# Patient Record
Sex: Female | Born: 1980 | Race: White | Hispanic: Yes | Smoking: Never smoker
Health system: Southern US, Community
[De-identification: ages and names within clinical notes are randomized; demographics above are authoritative.]

## PROBLEM LIST (undated history)

## (undated) ENCOUNTER — Inpatient Hospital Stay (HOSPITAL_COMMUNITY): Payer: Self-pay

## (undated) DIAGNOSIS — Z789 Other specified health status: Secondary | ICD-10-CM

## (undated) HISTORY — PX: GALLBLADDER SURGERY: SHX652

---

## 2001-09-14 ENCOUNTER — Encounter (INDEPENDENT_AMBULATORY_CARE_PROVIDER_SITE_OTHER): Payer: Self-pay

## 2001-09-14 ENCOUNTER — Inpatient Hospital Stay (HOSPITAL_COMMUNITY): Admission: AD | Admit: 2001-09-14 | Discharge: 2001-09-16 | Payer: Self-pay | Admitting: Obstetrics and Gynecology

## 2007-05-15 ENCOUNTER — Inpatient Hospital Stay (HOSPITAL_COMMUNITY): Admission: AD | Admit: 2007-05-15 | Discharge: 2007-05-15 | Payer: Self-pay | Admitting: Family Medicine

## 2007-05-15 ENCOUNTER — Ambulatory Visit: Payer: Self-pay | Admitting: Obstetrics and Gynecology

## 2007-08-21 ENCOUNTER — Ambulatory Visit: Payer: Self-pay | Admitting: Obstetrics and Gynecology

## 2007-08-21 ENCOUNTER — Inpatient Hospital Stay (HOSPITAL_COMMUNITY): Admission: AD | Admit: 2007-08-21 | Discharge: 2007-08-21 | Payer: Self-pay | Admitting: Family Medicine

## 2007-08-30 ENCOUNTER — Inpatient Hospital Stay (HOSPITAL_COMMUNITY): Admission: AD | Admit: 2007-08-30 | Discharge: 2007-09-01 | Payer: Self-pay | Admitting: Obstetrics and Gynecology

## 2007-08-30 ENCOUNTER — Ambulatory Visit: Payer: Self-pay | Admitting: *Deleted

## 2007-09-02 ENCOUNTER — Encounter: Admission: RE | Admit: 2007-09-02 | Discharge: 2007-09-02 | Payer: Self-pay | Admitting: Obstetrics & Gynecology

## 2008-01-01 ENCOUNTER — Emergency Department (HOSPITAL_COMMUNITY): Admission: EM | Admit: 2008-01-01 | Discharge: 2008-01-02 | Payer: Self-pay | Admitting: Emergency Medicine

## 2009-02-25 ENCOUNTER — Emergency Department (HOSPITAL_COMMUNITY): Admission: EM | Admit: 2009-02-25 | Discharge: 2009-02-26 | Payer: Self-pay | Admitting: Emergency Medicine

## 2009-03-29 ENCOUNTER — Emergency Department (HOSPITAL_COMMUNITY): Admission: EM | Admit: 2009-03-29 | Discharge: 2009-03-29 | Payer: Self-pay | Admitting: Emergency Medicine

## 2009-06-22 ENCOUNTER — Ambulatory Visit (HOSPITAL_COMMUNITY): Admission: RE | Admit: 2009-06-22 | Discharge: 2009-06-22 | Payer: Self-pay | Admitting: Obstetrics & Gynecology

## 2009-06-22 IMAGING — US US ABDOMEN COMPLETE
1 series · 14 of 25 positions shown · non-contrast
Comparison: None

CLINICAL DATA: Abdominal pain

ABDOMEN ULTRASOUND
TECHNIQUE: Complete abdominal ultrasound examination was performed
including evaluation of the liver, gallbladder, bile ducts,
pancreas, kidneys, spleen, IVC, and abdominal aorta.

[Series 1: unknown · 0.34mm/px · 14 of 55 slices shown]
[im 1/55]
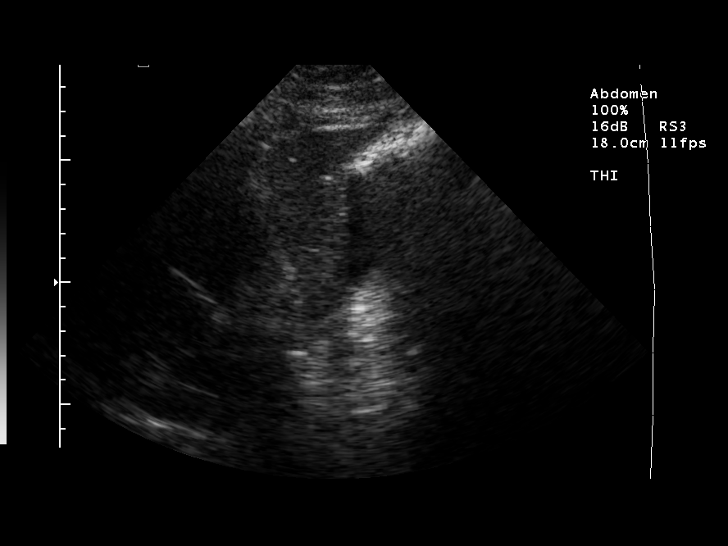
[im 5/55]
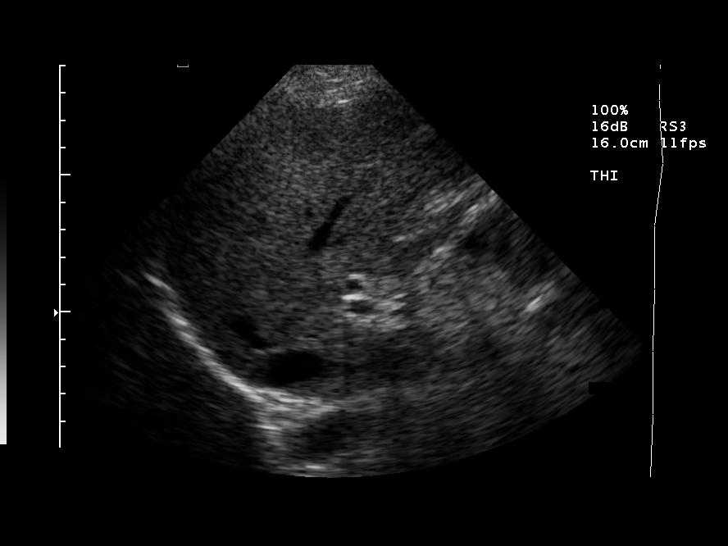
[im 10/55]
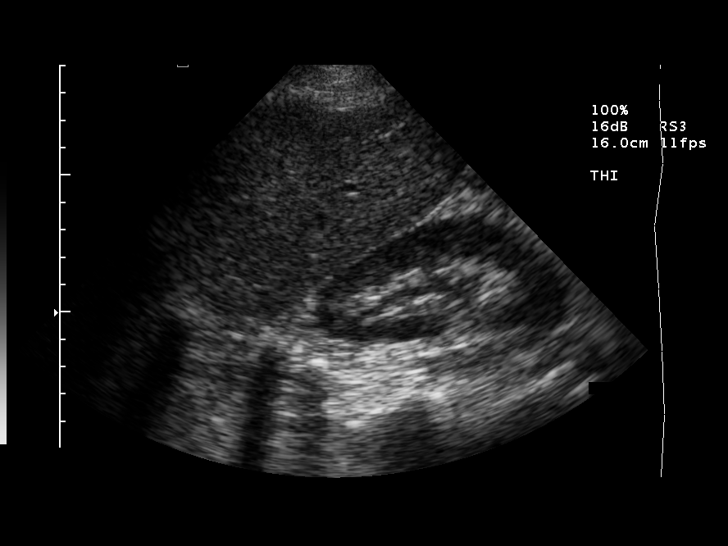
[im 14/55]
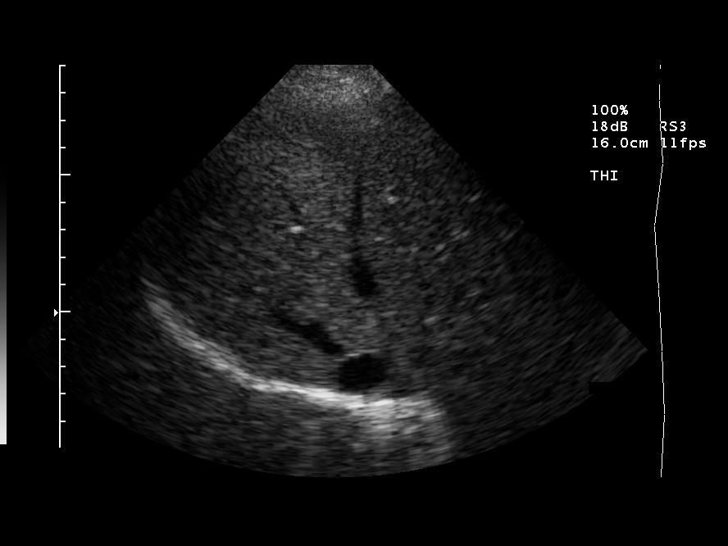
[im 19/55]
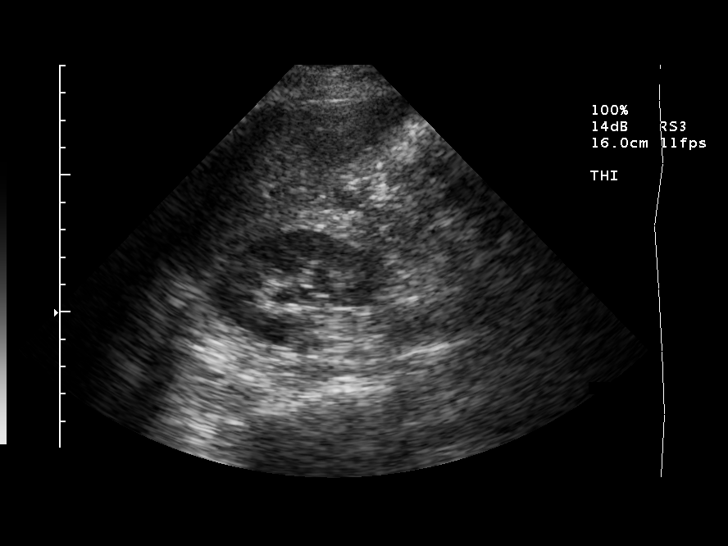
[im 21/55]
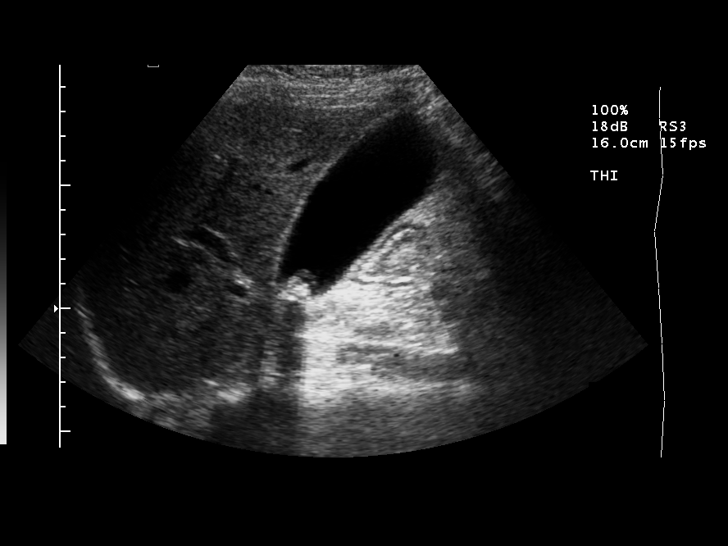
[im 25/55]
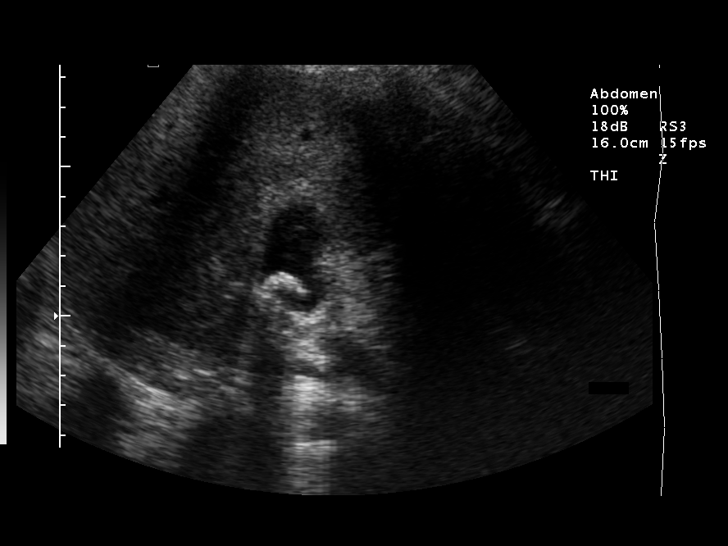
[im 30/55]
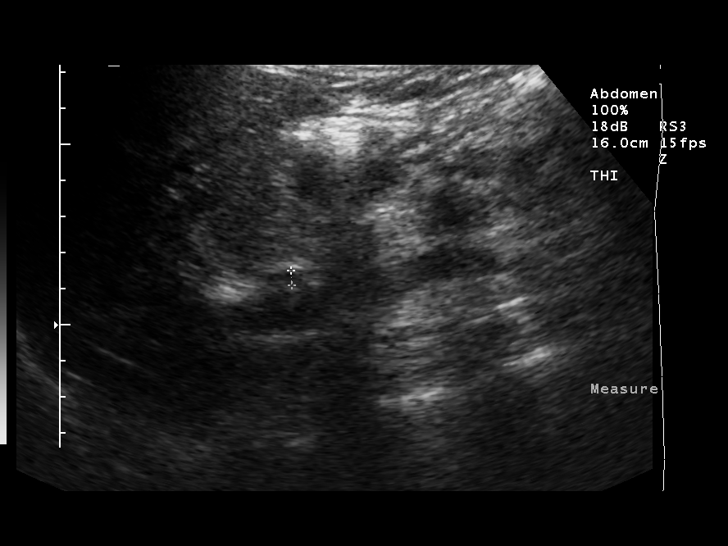
[im 34/55]
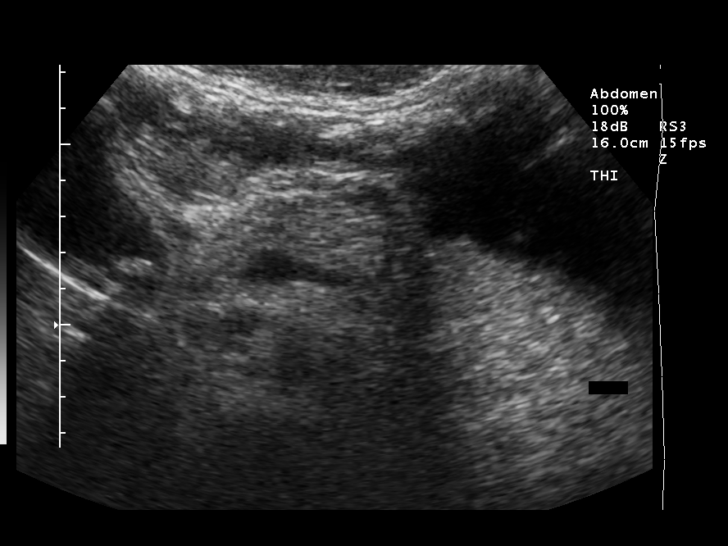
[im 37/55]
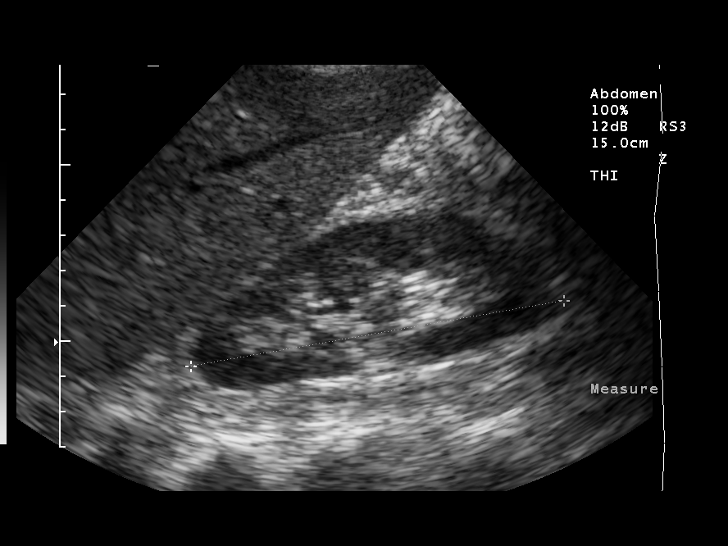
[im 41/55]
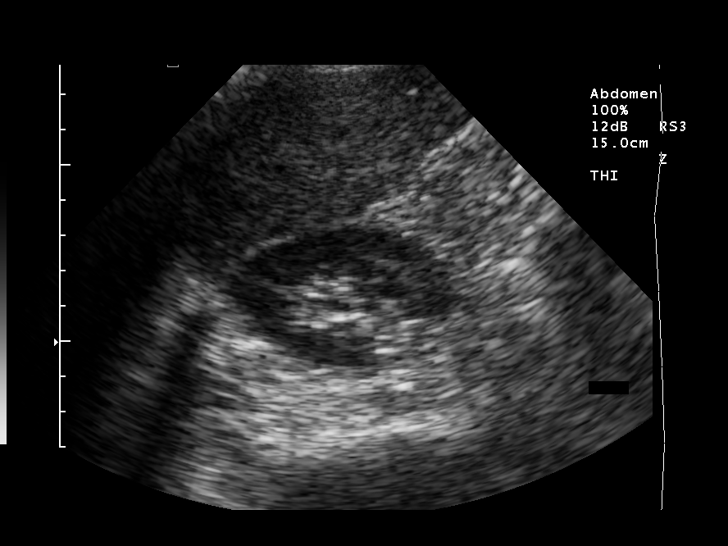
[im 46/55]
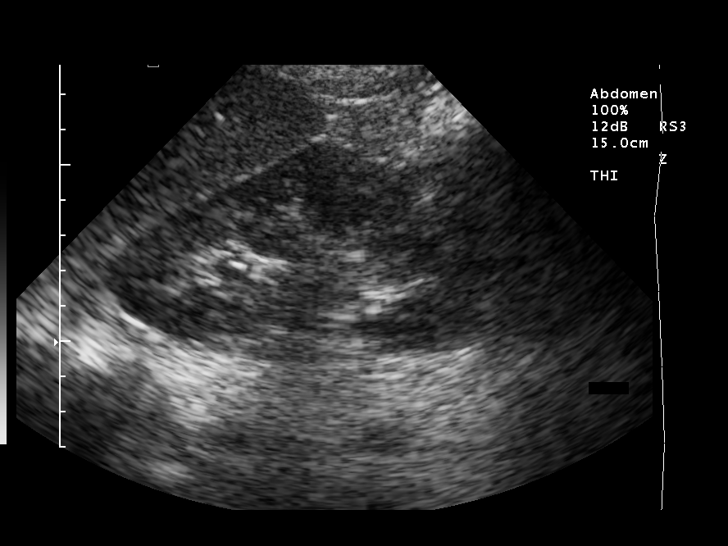
[im 50/55]
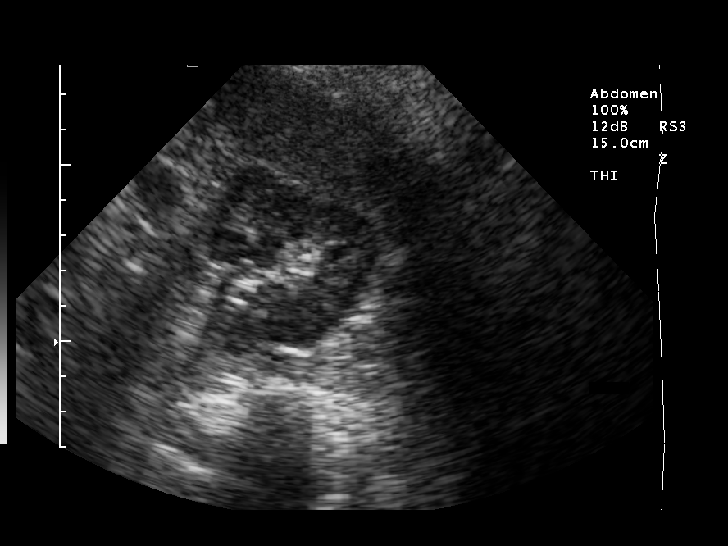
[im 55/55]
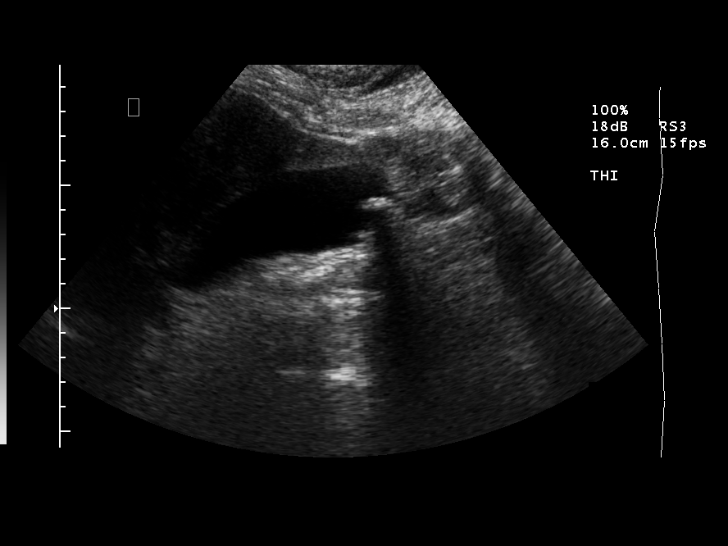

[14 of 25 positions shown; findings below may reference images not displayed]

FINDINGS: Normal appearance of the liver without intrahepatic
biliary dilatation.  There are echogenic structures within the
gallbladder with posterior acoustic shadowing.  Gallbladder wall
measures close to 3 mm and the gallbladder is moderately distended.
By report, the patient is tender to direct palpation of the
gallbladder.  Common bile duct measures 5 mm.  Pancreatic head and
body region are normal but the tail are obscured by bowel gas.  The
right kidney measures 10.5 cm in length without hydronephrosis..
The spleen measures up to 11.6 cm.  The left kidney has a normal
appearance, measuring 11 cm without hydronephrosis.  There are
multiple gallstones present.  No evidence for ascites.
IMPRESSION: Cholelithiasis with a sonographic Murphy's sign.  Findings can be
associated with acute cholecystitis.  No biliary dilatation.

## 2009-10-04 ENCOUNTER — Inpatient Hospital Stay (HOSPITAL_COMMUNITY): Admission: AD | Admit: 2009-10-04 | Discharge: 2009-10-06 | Payer: Self-pay | Admitting: Family Medicine

## 2009-10-04 ENCOUNTER — Ambulatory Visit: Payer: Self-pay | Admitting: Obstetrics and Gynecology

## 2010-08-16 IMAGING — US US OB TRANSVAGINAL MODIFY
1 series · 14 of 28 positions shown · non-contrast
Comparison: none

CLINICAL DATA: Abdominal pain.  First trimester pregnancy.

OBSTETRIC <14 WK US AND TRANSVAGINAL OB US
TECHNIQUE: Both transabdominal and transvaginal ultrasound
examinations were performed for complete evaluation of the
gestation as well as the maternal uterus, adnexal regions, and
pelvic cul-de-sac.

[Series 1: us ob transvaginal modify · 0.28mm/px · 14 of 48 slices shown]
[im 2/48]
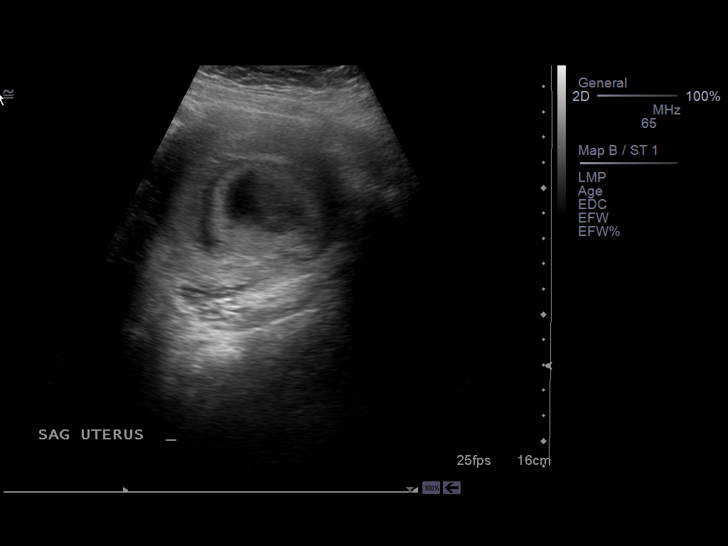
[im 6/48]
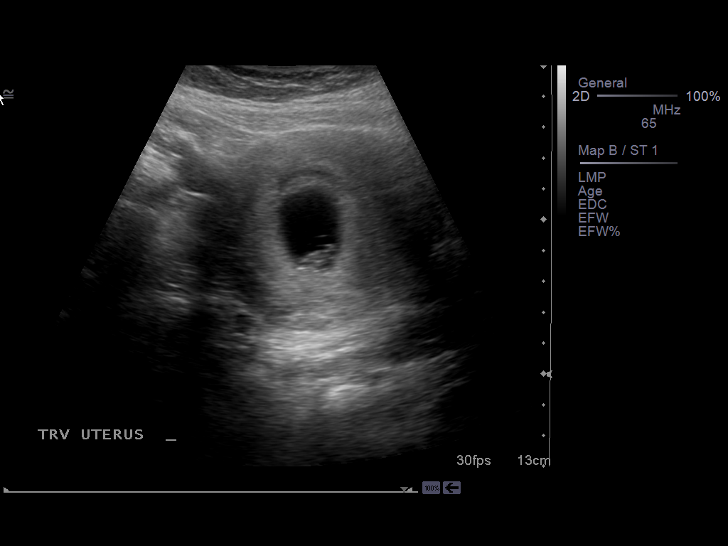
[im 9/48]
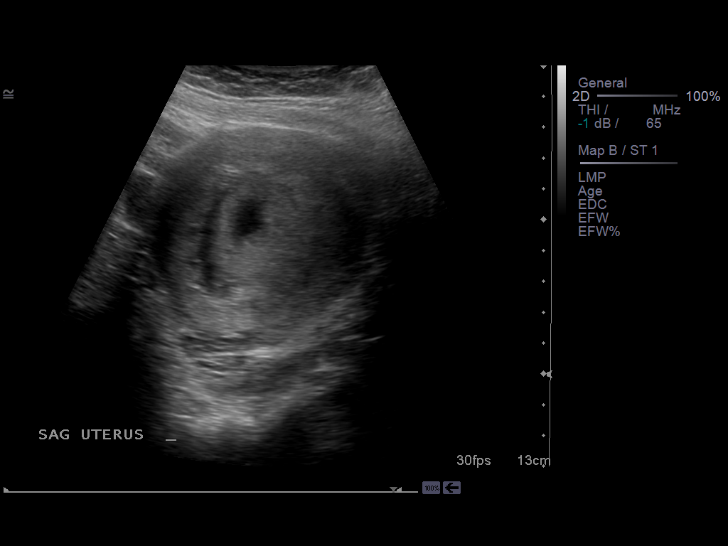
[im 13/48]
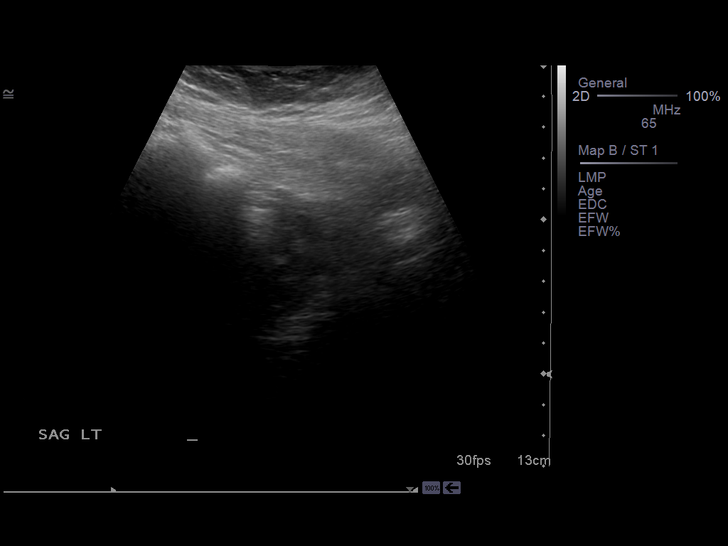
[im 16/48]
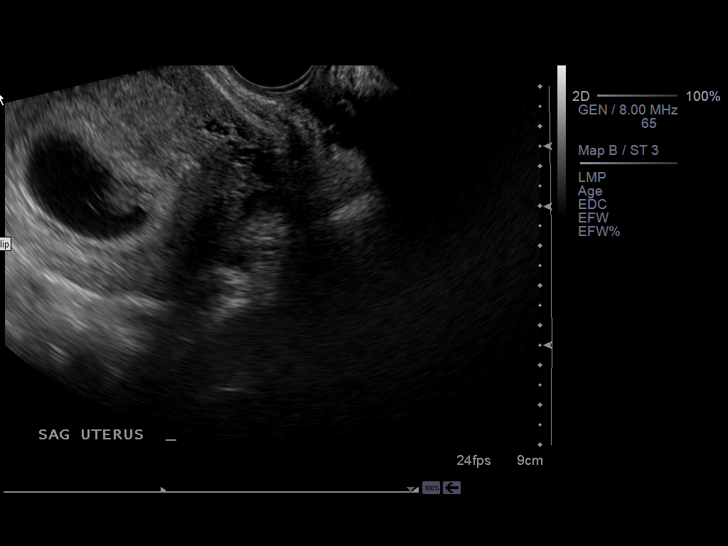
[im 20/48]
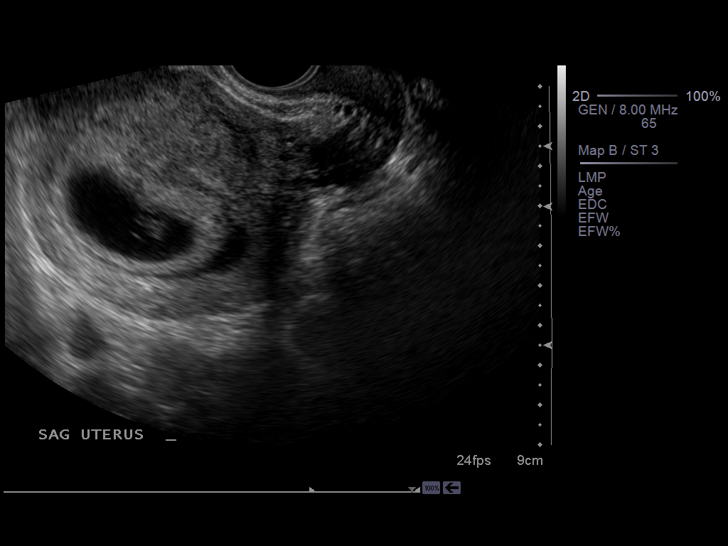
[im 23/48]
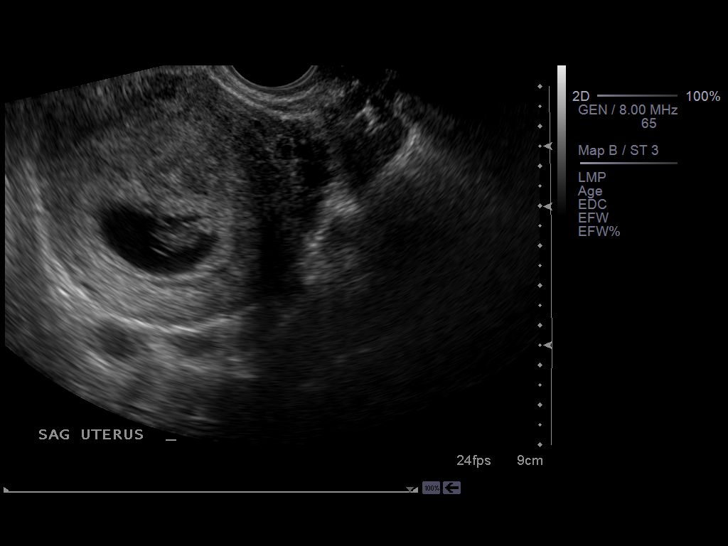
[im 27/48]
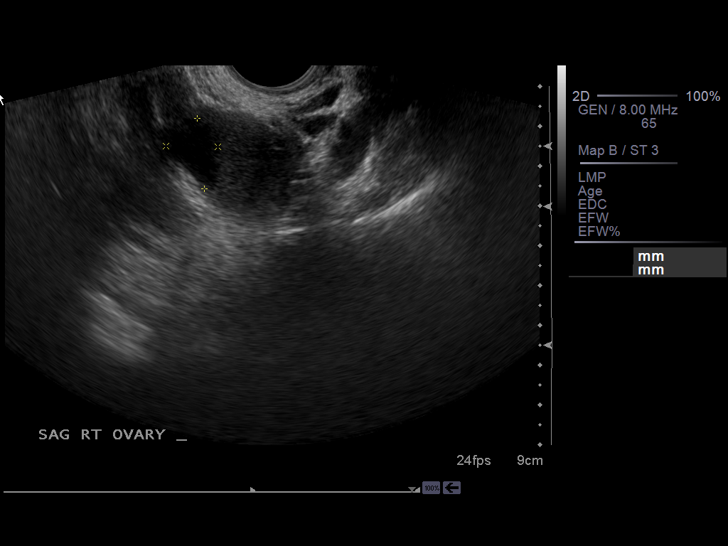
[im 30/48]
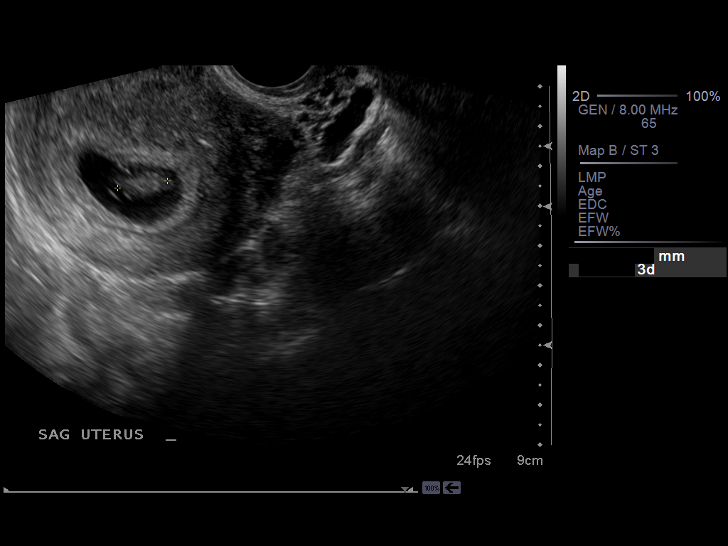
[im 34/48]
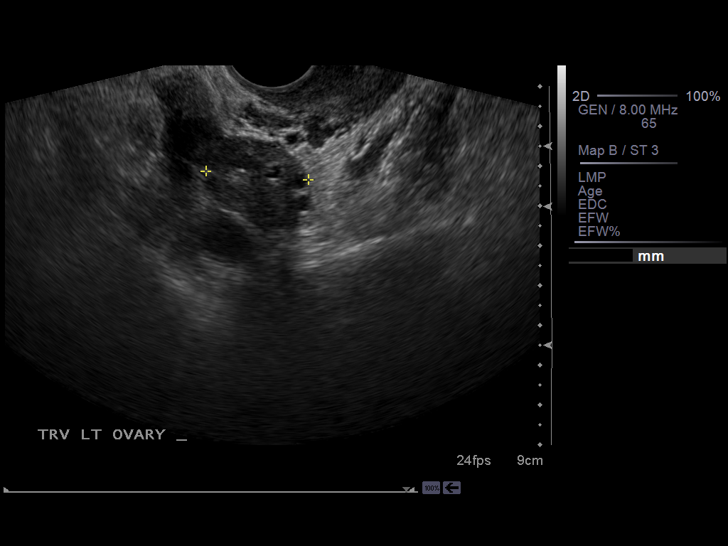
[im 37/48]
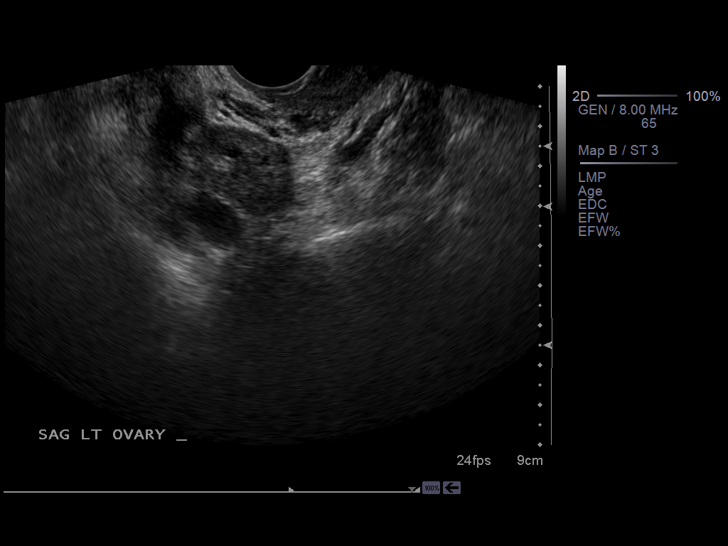
[im 41/48]
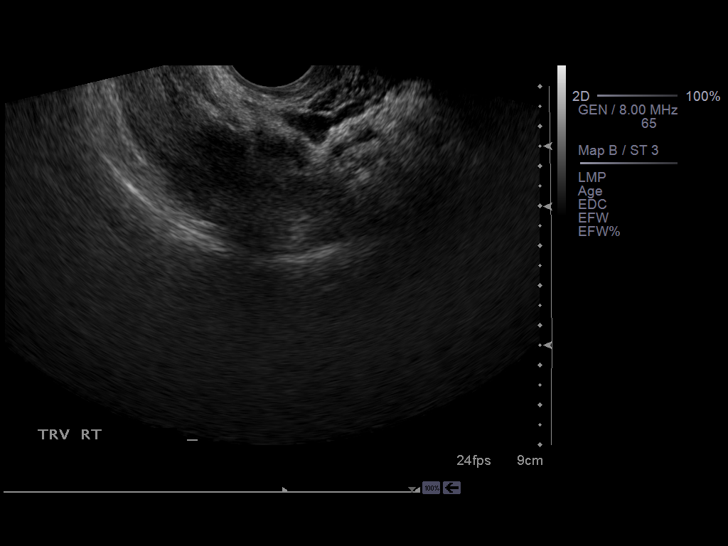
[im 44/48]
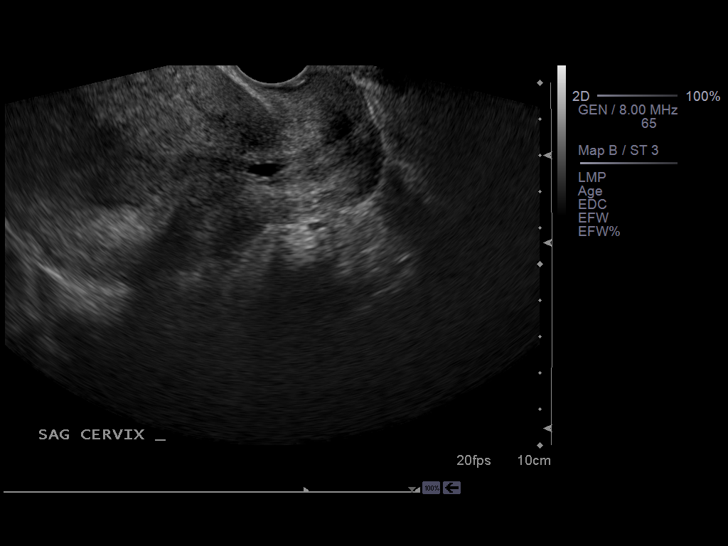
[im 48/48]
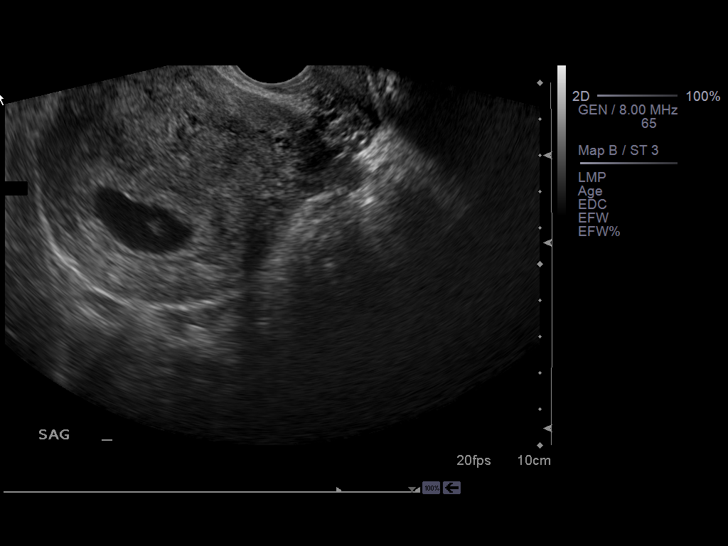

[14 of 28 positions shown; findings below may reference images not displayed]

FINDINGS: Single living intrauterine pregnancy noted with crown-
rump length of 12.7 cm compatible with 7 weeks 3 days gestation,
and with cardiac activity at 140 beats per minute.  Yolk sac noted
and the gestational sac appears properly sized subjectively.

There is a very small crescentic low echogenicity subchorionic
collection suggesting a small subchorionic hemorrhage.  There is a
small amount of fluid in the cervical canal.

Right ovary measures 3.8 x 2.6 x 2.9 cm contains a 1.8 x 1.3 x
cm cyst.

The left ovary measures 2.7 x 2.3 x 2.6 cm and appears
unremarkable.

No free pelvic fluid noted.
IMPRESSION: 1.  Single living intrauterine pregnancy measuring at 7 weeks 3
days gestation.  There is a very small subchorionic hemorrhage.
2.  Cyst in the right ovary.

## 2010-11-03 LAB — CBC
HCT: 20.7 % — ABNORMAL LOW (ref 36.0–46.0)
HCT: 25.2 % — ABNORMAL LOW (ref 36.0–46.0)
Hemoglobin: 6.2 g/dL — CL (ref 12.0–15.0)
Hemoglobin: 7.5 g/dL — ABNORMAL LOW (ref 12.0–15.0)
MCHC: 29.7 g/dL — ABNORMAL LOW (ref 30.0–36.0)
MCHC: 30.1 g/dL (ref 30.0–36.0)
MCV: 60 fL — ABNORMAL LOW (ref 78.0–100.0)
Platelets: 191 10*3/uL (ref 150–400)
RBC: 3.32 MIL/uL — ABNORMAL LOW (ref 3.87–5.11)
RBC: 4.19 MIL/uL (ref 3.87–5.11)
RDW: 19.2 % — ABNORMAL HIGH (ref 11.5–15.5)
RDW: 19.3 % — ABNORMAL HIGH (ref 11.5–15.5)
WBC: 10 10*3/uL (ref 4.0–10.5)

## 2010-11-19 LAB — URINALYSIS, ROUTINE W REFLEX MICROSCOPIC
Nitrite: NEGATIVE
Protein, ur: 30 mg/dL — AB
Specific Gravity, Urine: 1.03 (ref 1.005–1.030)
Urobilinogen, UA: 1 mg/dL (ref 0.0–1.0)
pH: 5.5 (ref 5.0–8.0)

## 2010-11-19 LAB — URINE MICROSCOPIC-ADD ON

## 2010-11-19 LAB — POCT PREGNANCY, URINE: Preg Test, Ur: POSITIVE

## 2010-11-21 LAB — POCT I-STAT, CHEM 8
Calcium, Ion: 1.2 mmol/L (ref 1.12–1.32)
Chloride: 105 mEq/L (ref 96–112)
Creatinine, Ser: 0.6 mg/dL (ref 0.4–1.2)
Glucose, Bld: 90 mg/dL (ref 70–99)
HCT: 36 % (ref 36.0–46.0)
Sodium: 138 mEq/L (ref 135–145)
TCO2: 21 mmol/L (ref 0–100)

## 2010-11-21 LAB — HCG, QUANTITATIVE, PREGNANCY: hCG, Beta Chain, Quant, S: 107907 m[IU]/mL — ABNORMAL HIGH (ref <5)

## 2010-11-21 LAB — GC/CHLAMYDIA PROBE AMP, GENITAL
Chlamydia, DNA Probe: POSITIVE — AB
GC Probe Amp, Genital: NEGATIVE

## 2010-11-21 LAB — URINALYSIS, ROUTINE W REFLEX MICROSCOPIC
Glucose, UA: NEGATIVE mg/dL
Ketones, ur: >80 mg/dL — AB
Specific Gravity, Urine: 1.043 — ABNORMAL HIGH (ref 1.005–1.030)
Urobilinogen, UA: 1 mg/dL (ref 0.0–1.0)

## 2010-11-21 LAB — WET PREP, GENITAL: Trich, Wet Prep: NONE SEEN

## 2010-11-21 LAB — URINE MICROSCOPIC-ADD ON

## 2010-11-21 LAB — URINE CULTURE: Culture: NO GROWTH

## 2011-05-03 LAB — CBC
HCT: 32 — ABNORMAL LOW
Hemoglobin: 10.4 — ABNORMAL LOW
MCHC: 32.4
Platelets: 257

## 2011-05-03 LAB — RUBELLA SCREEN: Rubella: >500 — ABNORMAL HIGH

## 2011-05-03 LAB — HERPES SIMPLEX VIRUS CULTURE: Culture: NOT DETECTED

## 2011-05-03 LAB — TYPE AND SCREEN
ABO/RH(D): O POS
Antibody Screen: NEGATIVE

## 2011-05-03 LAB — URINALYSIS, ROUTINE W REFLEX MICROSCOPIC
Ketones, ur: 15 — AB
Nitrite: NEGATIVE
Protein, ur: NEGATIVE
pH: 5.5

## 2011-05-03 LAB — DIFFERENTIAL
Basophils Relative: 0
Lymphs Abs: 2.3
Monocytes Absolute: 0.7
Monocytes Relative: 6
Neutro Abs: 8.7 — ABNORMAL HIGH

## 2011-05-03 LAB — GC/CHLAMYDIA PROBE AMP, GENITAL: GC Probe Amp, Genital: NEGATIVE

## 2011-05-03 LAB — RAPID URINE DRUG SCREEN, HOSP PERFORMED
Amphetamines: NOT DETECTED
Tetrahydrocannabinol: NOT DETECTED

## 2011-05-03 LAB — HIV ANTIBODY (ROUTINE TESTING W REFLEX): HIV: NONREACTIVE

## 2011-05-03 LAB — STREP B DNA PROBE

## 2011-05-03 LAB — RPR: RPR Ser Ql: NONREACTIVE

## 2011-05-03 LAB — HEPATITIS B SURFACE ANTIGEN: Hepatitis B Surface Ag: NEGATIVE

## 2011-05-04 LAB — CBC
HCT: 31.6 — ABNORMAL LOW
Hemoglobin: 10 — ABNORMAL LOW
RDW: 16.3 — ABNORMAL HIGH
WBC: 11.8 — ABNORMAL HIGH

## 2011-05-10 LAB — URINALYSIS, ROUTINE W REFLEX MICROSCOPIC
Glucose, UA: NEGATIVE
Leukocytes, UA: NEGATIVE
Protein, ur: NEGATIVE
Specific Gravity, Urine: 1.023
Urobilinogen, UA: 0.2

## 2011-05-10 LAB — COMPREHENSIVE METABOLIC PANEL
CO2: 24
Calcium: 9.1
Creatinine, Ser: 0.56
GFR calc non Af Amer: >60
Glucose, Bld: 130 — ABNORMAL HIGH
Total Protein: 7.1

## 2011-05-10 LAB — DIFFERENTIAL
Basophils Relative: 0
Eosinophils Relative: 0
Lymphocytes Relative: 9 — ABNORMAL LOW
Monocytes Relative: 2 — ABNORMAL LOW
Neutrophils Relative %: 89 — ABNORMAL HIGH

## 2011-05-10 LAB — URINE MICROSCOPIC-ADD ON

## 2011-05-10 LAB — POCT PREGNANCY, URINE: Preg Test, Ur: NEGATIVE

## 2011-05-10 LAB — CBC
Hemoglobin: 10.9 — ABNORMAL LOW
MCHC: 32.4
MCV: 68.2 — ABNORMAL LOW
RBC: 4.94
RDW: 15.4

## 2011-05-10 LAB — LIPASE, BLOOD: Lipase: 23

## 2011-05-25 LAB — GC/CHLAMYDIA PROBE AMP, GENITAL
Chlamydia, DNA Probe: POSITIVE — AB
GC Probe Amp, Genital: NEGATIVE

## 2014-08-14 NOTE — L&D Delivery Note (Signed)
**Note Kelly-Identified via Obfuscation**     Delivery Note AROM at 12:45 AM with clear fluid. After ROM, patient dilated to complete quickly and began to push. At 12:58 AM a viable and healthy female was precipitously delivered via Vaginal, Spontaneous Delivery (Presentation: Right Occiput Anterior).  APGAR: 9, 9; weight 8 lb 3.9 oz (3740 g).   Placenta status: Intact, Spontaneous.  Cord: 3 vessels with the following complications: None.  Cord pH: N/A  Anesthesia: None  Episiotomy: None Lacerations: None Suture Repair: none Est. Blood Loss (mL): 50  Mom to postpartum.  Baby to Couplet care / Skin to Skin.  Kelly Maldonado 03/11/2015, 1:54 AM

## 2015-01-03 ENCOUNTER — Inpatient Hospital Stay (HOSPITAL_COMMUNITY)
Admission: AD | Admit: 2015-01-03 | Discharge: 2015-01-04 | Disposition: A | Discharge status: Home / Self Care | Payer: Medicaid Other | Source: Ambulatory Visit | Attending: Obstetrics & Gynecology | Admitting: Obstetrics & Gynecology

## 2015-01-03 ENCOUNTER — Encounter (HOSPITAL_COMMUNITY): Payer: Self-pay | Admitting: *Deleted

## 2015-01-03 ENCOUNTER — Inpatient Hospital Stay (HOSPITAL_COMMUNITY): Payer: Medicaid Other

## 2015-01-03 DIAGNOSIS — D649 Anemia, unspecified: Secondary | ICD-10-CM

## 2015-01-03 DIAGNOSIS — Z3689 Encounter for other specified antenatal screening: Secondary | ICD-10-CM

## 2015-01-03 DIAGNOSIS — O99013 Anemia complicating pregnancy, third trimester: Secondary | ICD-10-CM | POA: Diagnosis not present

## 2015-01-03 DIAGNOSIS — O2343 Unspecified infection of urinary tract in pregnancy, third trimester: Secondary | ICD-10-CM | POA: Diagnosis not present

## 2015-01-03 DIAGNOSIS — Z3A31 31 weeks gestation of pregnancy: Secondary | ICD-10-CM | POA: Diagnosis not present

## 2015-01-03 DIAGNOSIS — R42 Dizziness and giddiness: Secondary | ICD-10-CM | POA: Diagnosis present

## 2015-01-03 DIAGNOSIS — O0930 Supervision of pregnancy with insufficient antenatal care, unspecified trimester: Secondary | ICD-10-CM | POA: Diagnosis present

## 2015-01-03 DIAGNOSIS — O0933 Supervision of pregnancy with insufficient antenatal care, third trimester: Secondary | ICD-10-CM | POA: Diagnosis not present

## 2015-01-03 HISTORY — DX: Other specified health status: Z78.9

## 2015-01-03 LAB — URINE MICROSCOPIC-ADD ON

## 2015-01-03 LAB — URINALYSIS, ROUTINE W REFLEX MICROSCOPIC
GLUCOSE, UA: NEGATIVE mg/dL
HGB URINE DIPSTICK: NEGATIVE
KETONES UR: 40 mg/dL — AB
NITRITE: POSITIVE — AB
Protein, ur: 30 mg/dL — AB
Urobilinogen, UA: 1 mg/dL (ref 0.0–1.0)
pH: 5.5 (ref 5.0–8.0)

## 2015-01-03 LAB — CBC
HEMATOCRIT: 23.9 % — AB (ref 36.0–46.0)
Hemoglobin: 6.8 g/dL — CL (ref 12.0–15.0)
MCH: 16.4 pg — ABNORMAL LOW (ref 26.0–34.0)
MCHC: 28.5 g/dL — ABNORMAL LOW (ref 30.0–36.0)
MCV: 57.7 fL — ABNORMAL LOW (ref 78.0–100.0)
PLATELETS: 281 10*3/uL (ref 150–400)
RBC: 4.14 MIL/uL (ref 3.87–5.11)
RDW: 18.9 % — AB (ref 11.5–15.5)
WBC: 10.5 10*3/uL (ref 4.0–10.5)

## 2015-01-03 LAB — OB RESULTS CONSOLE HIV ANTIBODY (ROUTINE TESTING): HIV: NONREACTIVE

## 2015-01-03 LAB — RAPID HIV SCREEN (HIV 1/2 AB+AG)
HIV 1/2 Antibodies: NONREACTIVE
HIV-1 P24 ANTIGEN - HIV24: NONREACTIVE

## 2015-01-03 LAB — HEPATITIS B SURFACE ANTIGEN: Hepatitis B Surface Ag: NEGATIVE

## 2015-01-03 LAB — DIFFERENTIAL
Basophils Absolute: 0 10*3/uL (ref 0.0–0.1)
Basophils Relative: 0 % (ref 0–1)
Eosinophils Absolute: 0.4 10*3/uL (ref 0.0–0.7)
Eosinophils Relative: 4 % (ref 0–5)
Lymphocytes Relative: 22 % (ref 12–46)
Lymphs Abs: 2.3 10*3/uL (ref 0.7–4.0)
MONO ABS: 0.6 10*3/uL (ref 0.1–1.0)
Monocytes Relative: 5 % (ref 3–12)
NEUTROS ABS: 7.2 10*3/uL (ref 1.7–7.7)
Neutrophils Relative %: 69 % (ref 43–77)

## 2015-01-03 LAB — PREPARE RBC (CROSSMATCH)

## 2015-01-03 LAB — ABO/RH: ABO/RH(D): O POS

## 2015-01-03 MED ORDER — SODIUM CHLORIDE 0.9 % IV SOLN: Freq: Once | INTRAVENOUS | Status: DC

## 2015-01-03 MED ORDER — FERROUS SULFATE 325 (65 FE) MG PO TABS
325.0000 mg | ORAL_TABLET | Freq: Two times a day (BID) | ORAL | Status: DC
Start: 2015-01-03 — End: 2022-05-27

## 2015-01-03 MED ORDER — ACETAMINOPHEN 500 MG PO TABS
1000.0000 mg | ORAL_TABLET | Freq: Once | ORAL | Status: AC
  Administered 2015-01-03: 1000 mg via ORAL
  Filled 2015-01-03: qty 2

## 2015-01-03 MED ORDER — SODIUM CHLORIDE 0.9 % IV SOLN
Freq: Once | INTRAVENOUS | Status: AC
  Administered 2015-01-03: 13:00:00 via INTRAVENOUS

## 2015-01-03 MED ORDER — PRENATAL COMPLETE 14-0.4 MG PO TABS: 1.0000 | ORAL_TABLET | Freq: Every morning | ORAL | Status: DC

## 2015-01-03 MED ORDER — CEPHALEXIN 500 MG PO CAPS: 500.0000 mg | ORAL_CAPSULE | Freq: Four times a day (QID) | ORAL | Status: DC

## 2015-01-03 MED ORDER — DIPHENHYDRAMINE HCL 25 MG PO CAPS
50.0000 mg | ORAL_CAPSULE | Freq: Once | ORAL | Status: AC
  Administered 2015-01-03: 50 mg via ORAL
  Filled 2015-01-03: qty 2

## 2015-01-03 NOTE — Progress Notes (Signed)
Checked on patients needs.  °Spanish Interpreter  °

## 2015-01-03 NOTE — Progress Notes (Signed)
Checked on patients needs. Also assisted RN with interpretation of medical questions.  Spanish Interpreter

## 2015-01-03 NOTE — Progress Notes (Signed)
Checked on patients needs. Also assisted RN with interpretation of medical procedure.  Also ordered patients breakfast and late snack.  Spanish Interpreter

## 2015-01-03 NOTE — MAU Note (Signed)
Pt. States she is here for prenatal care. LMP in October 2015. Denies bleeding or LOF. Pt. Feels baby move well. Pt. States she is feeling dizzy and this began yesterday. Pt. Got out of her car yesterday and felt short of breath at that time and states she had a headache at that time as well. Pt. States she is having nausea and vomiting throughout pregnancy as well. Pt. States she delivered close to her due date with all of her other pregnancies. Last pregnancy was 2 years ago. Pt. States she believes she needed a blood transfusion 2 pregnancies ago and [place]

## 2015-01-03 NOTE — MAU Note (Signed)
Marta spanish interpreter in room with provider. Pt. To be transferred to room 304. Food tray ordered at this time. PT. And SO aware and agreeable to plan of care.

## 2015-01-03 NOTE — Progress Notes (Signed)
Checked on patients needs. Also ordered patients dinner Spanish Interpreter

## 2015-01-03 NOTE — MAU Note (Signed)
Report given to Osvaldo HumanKim Coddle, RN ok for pt. Transport to room 304.

## 2015-01-03 NOTE — H&P (Signed)
Shyrl NumbersMinerva Navarrete-Cruz is a 56P5, 34 y.o. female presenting for dizziness, weakness and fatigue, no PNC Maternal Medical History:  Reason for admission: G6P5 at 31 wks no prenatal care in with c/o malaise and feelinf dizzy  Prenatal complications: No prenatal care and severe anemia    OB History    Gravida Para Term Preterm AB TAB SAB Ectopic Multiple Living   6 5 5  0 0 0 0 0 0 5      Obstetric Comments   Pt. States she delivered her first baby here and two other babies at this hospital. Denies prenatal care until now.      Past Medical History  Diagnosis Date  . Medical history non-contributory    Past Surgical History  Procedure Laterality Date  . Gallbladder surgery     Family History: family history is not on file. Social History:  reports that she has never smoked. She does not have any smokeless tobacco history on file. She reports that she does not drink alcohol or use illicit drugs.   Prenatal Transfer Tool  Maternal Diabetes: No Genetic Screening: Declined Maternal Ultrasounds/Referrals: Declined Fetal Ultrasounds or other Referrals:  None Maternal Substance Abuse:  No Significant Maternal Medications:  None Significant Maternal Lab Results:  None Other Comments:  no prenatal care  Review of Systems  Constitutional: Positive for malaise/fatigue.  HENT: Negative.   Eyes: Positive for blurred vision.  Respiratory: Negative.   Cardiovascular: Negative.   Gastrointestinal: Negative.   Genitourinary: Negative.   Musculoskeletal: Negative.   Neurological: Positive for dizziness and weakness.  Endo/Heme/Allergies: Negative.   Psychiatric/Behavioral: Negative.       Blood pressure 106/69, pulse 93, temperature 98.3 F (36.8 C), temperature source Oral, resp. rate 18, weight 171 lb (77.565 kg), SpO2 99 %. Maternal Exam:  Uterine Assessment: No contractions  Abdomen: Fetal presentation: breech  Introitus: Normal vulva. Normal vagina.  Ferning test: not done.   Nitrazine test: not done.  Pelvis: adequate for delivery.      Fetal Exam Fetal Monitor Review: Mode: ultrasound.   Variability: moderate (6-25 bpm).   Pattern: no accelerations.    Fetal State Assessment: Category I - tracings are normal.     Physical Exam  Constitutional: She is oriented to person, place, and time. She appears well-developed and well-nourished.  HENT:  Head: Normocephalic.  Eyes: Pupils are equal, round, and reactive to light.  Neck: Normal range of motion.  Cardiovascular: Normal rate, regular rhythm, normal heart sounds and intact distal pulses.   Respiratory: Effort normal and breath sounds normal.  GI: Soft. Bowel sounds are normal.  Musculoskeletal: Normal range of motion.  Neurological: She is alert and oriented to person, place, and time. She has normal reflexes.  Skin: Skin is warm and dry.  Psychiatric: She has a normal mood and affect. Her behavior is normal. Judgment and thought content normal.    Prenatal labs: ABO, Rh: --/--/O POS (05/22 [id number]) Antibody: NEG (05/22 0954) Rubella:   RPR:    HBsAg:    HIV:    GBS:     Assessment/Plan: Discussed POC with Dr. Macon LargeAnyanwu pt to received 3 uts of packed RBC's [facility]  LAWSON, MARIE DARLENE 01/03/2015, 11:40 AM

## 2015-01-04 DIAGNOSIS — O0930 Supervision of pregnancy with insufficient antenatal care, unspecified trimester: Secondary | ICD-10-CM

## 2015-01-04 DIAGNOSIS — Z3A31 31 weeks gestation of pregnancy: Secondary | ICD-10-CM

## 2015-01-04 DIAGNOSIS — O2343 Unspecified infection of urinary tract in pregnancy, third trimester: Secondary | ICD-10-CM

## 2015-01-04 LAB — RPR: RPR Ser Ql: NONREACTIVE

## 2015-01-04 LAB — TYPE AND SCREEN
ABO/RH(D): O POS
ANTIBODY SCREEN: NEGATIVE
UNIT DIVISION: 0
Unit division: 0
Unit division: 0

## 2015-01-04 LAB — CBC
HCT: 29.1 % — ABNORMAL LOW (ref 36.0–46.0)
Hemoglobin: 8.9 g/dL — ABNORMAL LOW (ref 12.0–15.0)
MCH: 19.1 pg — ABNORMAL LOW (ref 26.0–34.0)
MCHC: 30.6 g/dL (ref 30.0–36.0)
MCV: 62.3 fL — ABNORMAL LOW (ref 78.0–100.0)
Platelets: 255 10*3/uL (ref 150–400)
RBC: 4.67 MIL/uL (ref 3.87–5.11)
RDW: 22.5 % — ABNORMAL HIGH (ref 11.5–15.5)
WBC: 11.6 10*3/uL — ABNORMAL HIGH (ref 4.0–10.5)

## 2015-01-04 LAB — CULTURE, OB URINE

## 2015-01-04 LAB — RUBELLA SCREEN: Rubella: 17.2 index (ref >0.99)

## 2015-01-04 NOTE — Discharge Summary (Signed)
Antenatal Physician Discharge Summary  Patient ID: Kelly Maldonado MRN: [id number] DOB/AGE: 10/28/1980 34 y.o.  Admit date: 01/03/2015 Discharge date: 01/04/2015  Admission Diagnoses: Intrauterine pregnancy at 5582w4d, no prenatal care, urinary tract infection, symptomatic anemia  Discharge Diagnoses:  Intrauterine pregnancy at 4742w5d, no prenatal care, urinary tract infection being treated, symptomatic anemia s/p transfusion  Prenatal Procedures: NST, Ultrasound   Significant Diagnostic Studies:  Results for orders placed or performed during the hospital encounter of 01/03/15 (from the past 168 hour(s))  Urinalysis, Routine w reflex microscopic   Collection Time: 01/03/15  8:50 AM  Result Value Ref Range   Color, Urine RED (A) YELLOW   APPearance TURBID (A) CLEAR   Specific Gravity, Urine >1.030 (H) 1.005 - 1.030   pH 5.5 5.0 - 8.0   Glucose, UA NEGATIVE NEGATIVE mg/dL   Hgb urine dipstick NEGATIVE NEGATIVE   Bilirubin Urine SMALL (A) NEGATIVE   Ketones, ur 40 (A) NEGATIVE mg/dL   Protein, ur 30 (A) NEGATIVE mg/dL   Urobilinogen, UA 1.0 0.0 - 1.0 mg/dL   Nitrite POSITIVE (A) NEGATIVE   Leukocytes, UA SMALL (A) NEGATIVE  Urine microscopic-add on   Collection Time: 01/03/15  8:50 AM  Result Value Ref Range   Squamous Epithelial / LPF RARE RARE   WBC, UA 3-6 <3 WBC/hpf   Bacteria, UA RARE RARE   Urine-Other MUCOUS PRESENT   Hepatitis B surface antigen   Collection Time: 01/03/15  9:54 AM  Result Value Ref Range   Hepatitis B Surface Ag NEGATIVE NEGATIVE  CBC   Collection Time: 01/03/15  9:54 AM  Result Value Ref Range   WBC 10.5 4.0 - 10.5 K/uL   RBC 4.14 3.87 - 5.11 MIL/uL   Hemoglobin 6.8 (LL) 12.0 - 15.0 g/dL   HCT 64.423.9 (L) 03.436.0 - 74.246.0 %   MCV 57.7 (L) 78.0 - 100.0 fL   MCH 16.4 (L) 26.0 - 34.0 pg   MCHC 28.5 (L) 30.0 - 36.0 g/dL   RDW 59.518.9 (H) 63.811.5 - 75.615.5 %   Platelets 281 150 - 400 K/uL  Differential   Collection Time: 01/03/15  9:54 AM  Result Value Ref  Range   Neutrophils Relative % 69 43 - 77 %   Neutro Abs 7.2 1.7 - 7.7 K/uL   Lymphocytes Relative 22 12 - 46 %   Lymphs Abs 2.3 0.7 - 4.0 K/uL   Monocytes Relative 5 3 - 12 %   Monocytes Absolute 0.6 0.1 - 1.0 K/uL   Eosinophils Relative 4 0 - 5 %   Eosinophils Absolute 0.4 0.0 - 0.7 K/uL   Basophils Relative 0 0 - 1 %   Basophils Absolute 0.0 0.0 - 0.1 K/uL  Rapid HIV screen (HIV 1/2 Ab+Ag)   Collection Time: 01/03/15  9:54 AM  Result Value Ref Range   HIV-1 P24 Antigen - HIV24 NON REACTIVE NON REACTIVE   HIV 1/2 Antibodies NON REACTIVE NON REACTIVE   Interpretation (HIV Ag Ab)      A non reactive test result means that HIV 1 or HIV 2 antibodies and HIV 1 p24 antigen were not detected in the specimen.  Type and screen   Collection Time: 01/03/15  9:54 AM  Result Value Ref Range   ABO/RH(D) O POS    Antibody Screen NEG    Sample Expiration 01/06/2015    Unit Number [id number]    Blood Component Type RBC LR PHER2    Unit division 00    Status of Unit ISSUED  Transfusion Status OK TO TRANSFUSE    Crossmatch Result Compatible    Unit Number [id number]    Blood Component Type RBC LR PHER2    Unit division 00    Status of Unit ISSUED    Transfusion Status OK TO TRANSFUSE    Crossmatch Result Compatible    Unit Number [id number]    Blood Component Type RBC LR PHER2    Unit division 00    Status of Unit ISSUED    Transfusion Status OK TO TRANSFUSE    Crossmatch Result Compatible   ABO/Rh   Collection Time: 01/03/15  9:54 AM  Result Value Ref Range   ABO/RH(D) O POS   Prepare RBC   Collection Time: 01/03/15 12:23 PM  Result Value Ref Range   Order Confirmation ORDER PROCESSED BY BLOOD BANK   CBC   Collection Time: 01/04/15  5:20 AM  Result Value Ref Range   WBC 11.6 (H) 4.0 - 10.5 K/uL   RBC 4.67 3.87 - 5.11 MIL/uL   Hemoglobin 8.9 (L) 12.0 - 15.0 g/dL   HCT 59.5 (L) 63.8 - 75.6 %   MCV 62.3 (L) 78.0 - 100.0 fL   MCH 19.1 (L) 26.0 - 34.0 pg   MCHC 30.6  30.0 - 36.0 g/dL   RDW 43.3 (H) 29.5 - 18.8 %   Platelets 255 150 - 400 K/uL    Treatments: IV hydration, antibiotics: Keflex and tranfusion of 3 units of pRBcs  Hospital Course:  This is a 34 y.o. Kelly Maldonado with IUP at [redacted]w[redacted]d admitted for symptomatic anemia requiring transfusion; also had UTI and no prenatal care.  Initial hemoglobin was 6.8 and she received transfusion of three units of pRBCs without complication.  Discharge hemoglobin was 8.9, symptoms resolved.  She also received Keflex to treat her UTI.    The fetal heart rate monitoring remained reassuring, and she had no other maternal-fetal concerns.  She was deemed stable for discharge to home with outpatient follow up.  Discharge Exam: BP 92/57 mmHg  Pulse 79  Temp(Src) 98.6 F (37 C) (Oral)  Resp 18  Ht  (1.676 m)  Wt 171 lb (77.565 kg)  BMI 27.61 kg/m2  SpO2 100% General appearance: alert and no distress Head: Normocephalic, without obvious abnormality, atraumatic Back: symmetric, no curvature. ROM normal. No CVA tenderness. Resp: normal respiratory effort Cardio: regular rate and rhythm GI: soft, gravid, non-tender; bowel sounds normal; no masses,  no organomegaly Pelvic: deferred Extremities: extremities normal, atraumatic, no cyanosis or edema and Homans sign is negative, no sign of DVT Pulses: 2+ and symmetric Skin: Skin color, texture, turgor normal. No rashes or lesions Neurologic: Alert and oriented X 3, normal strength and tone. Normal symmetric reflexes. Normal coordination and gait  Discharge Condition: Stable  Disposition:  Home     Medication List    TAKE these medications        cephALEXin 500 MG capsule  Commonly known as:  KEFLEX  Take 1 capsule (500 mg total) by mouth 4 (four) times daily.     ferrous sulfate 325 (65 FE) MG tablet  Commonly known as:  FERROUSUL  Take 1 tablet (325 mg total) by mouth 2 (two) times daily with a meal.     PRENATAL COMPLETE 14-0.4 MG Tabs  Take 1 tablet by  mouth every morning.           Follow-up Information    Follow up with [facility].   Why:  For prenatal care  Contact information:   [address] [place] [address] [phone]      Signed: Jaynie Collins A M.D. 01/04/2015, 7:29 AM

## 2015-01-04 NOTE — Progress Notes (Signed)
Pt discharged home with significant other... Discharge instructions reviewed with pt with E. Royal, Interpreter, present at bedside... Pt verbalized understanding, and stated she had no questions... Condition stable... No equipment... Ambulated to car with Selinda MichaelsE. Gurnie Duris, RN.

## 2015-01-04 NOTE — Progress Notes (Signed)
RROB in to perform NST- NST reactive and reassuring, fhr baseline 140, moderate variability(average), 15x15 accels. Pt reports positive fetal movement. Second reviewer=Mary Early,RNC

## 2015-01-12 ENCOUNTER — Encounter: Payer: Self-pay | Admitting: Advanced Practice Midwife

## 2015-02-09 ENCOUNTER — Encounter: Payer: Self-pay | Admitting: General Practice

## 2015-03-10 ENCOUNTER — Inpatient Hospital Stay (HOSPITAL_COMMUNITY)
Admission: AD | Admit: 2015-03-10 | Discharge: 2015-03-12 | DRG: 775 | Disposition: A | Discharge status: Home / Self Care | Payer: Medicaid Other | Source: Ambulatory Visit | Attending: Obstetrics and Gynecology | Admitting: Obstetrics and Gynecology

## 2015-03-10 ENCOUNTER — Encounter (HOSPITAL_COMMUNITY): Payer: Self-pay | Admitting: *Deleted

## 2015-03-10 DIAGNOSIS — O2343 Unspecified infection of urinary tract in pregnancy, third trimester: Secondary | ICD-10-CM

## 2015-03-10 DIAGNOSIS — Z3A40 40 weeks gestation of pregnancy: Secondary | ICD-10-CM | POA: Diagnosis present

## 2015-03-10 DIAGNOSIS — D649 Anemia, unspecified: Secondary | ICD-10-CM | POA: Diagnosis present

## 2015-03-10 DIAGNOSIS — O0933 Supervision of pregnancy with insufficient antenatal care, third trimester: Secondary | ICD-10-CM | POA: Diagnosis not present

## 2015-03-10 DIAGNOSIS — O9902 Anemia complicating childbirth: Principal | ICD-10-CM | POA: Diagnosis present

## 2015-03-10 LAB — DIFFERENTIAL
BASOS PCT: 0 % (ref 0–1)
Basophils Absolute: 0 10*3/uL (ref 0.0–0.1)
Eosinophils Absolute: 0.2 10*3/uL (ref 0.0–0.7)
Eosinophils Relative: 2 % (ref 0–5)
LYMPHS ABS: 2.2 10*3/uL (ref 0.7–4.0)
Lymphocytes Relative: 20 % (ref 12–46)
MONO ABS: 0.5 10*3/uL (ref 0.1–1.0)
MONOS PCT: 5 % (ref 3–12)
NEUTROS ABS: 7.9 10*3/uL — AB (ref 1.7–7.7)
Neutrophils Relative %: 73 % (ref 43–77)
OTHER: 0 %

## 2015-03-10 LAB — URINALYSIS, ROUTINE W REFLEX MICROSCOPIC
Glucose, UA: NEGATIVE mg/dL
HGB URINE DIPSTICK: NEGATIVE
Leukocytes, UA: NEGATIVE
Nitrite: NEGATIVE
Protein, ur: NEGATIVE mg/dL
Specific Gravity, Urine: >1.03 — ABNORMAL HIGH (ref 1.005–1.030)
Urobilinogen, UA: 1 mg/dL (ref 0.0–1.0)
pH: 6 (ref 5.0–8.0)

## 2015-03-10 LAB — CBC
HEMATOCRIT: 32.8 % — AB (ref 36.0–46.0)
Hemoglobin: 10.1 g/dL — ABNORMAL LOW (ref 12.0–15.0)
MCH: 21.7 pg — AB (ref 26.0–34.0)
MCHC: 30.8 g/dL (ref 30.0–36.0)
MCV: 70.5 fL — ABNORMAL LOW (ref 78.0–100.0)
Platelets: 230 10*3/uL (ref 150–400)
RBC: 4.65 MIL/uL (ref 3.87–5.11)
RDW: 22.1 % — ABNORMAL HIGH (ref 11.5–15.5)
WBC: 10.8 10*3/uL — ABNORMAL HIGH (ref 4.0–10.5)

## 2015-03-10 LAB — TYPE AND SCREEN
ABO/RH(D): O POS
ANTIBODY SCREEN: NEGATIVE

## 2015-03-10 LAB — GROUP B STREP BY PCR: GROUP B STREP BY PCR: NEGATIVE

## 2015-03-10 LAB — OB RESULTS CONSOLE GBS: STREP GROUP B AG: NEGATIVE

## 2015-03-10 MED ORDER — OXYCODONE-ACETAMINOPHEN 5-325 MG PO TABS
1.0000 | ORAL_TABLET | ORAL | Status: DC | PRN
Start: 2015-03-10 — End: 2015-03-11
  Administered 2015-03-11: 1 via ORAL
  Filled 2015-03-10: qty 1

## 2015-03-10 MED ORDER — OXYTOCIN 40 UNITS IN LACTATED RINGERS INFUSION - SIMPLE MED
1.0000 m[IU]/min | INTRAVENOUS | Status: DC
  Administered 2015-03-10: 2 m[IU]/min via INTRAVENOUS
  Filled 2015-03-10: qty 1000

## 2015-03-10 MED ORDER — ACETAMINOPHEN 325 MG PO TABS: 650.0000 mg | ORAL_TABLET | ORAL | Status: DC | PRN

## 2015-03-10 MED ORDER — OXYTOCIN BOLUS FROM INFUSION
500.0000 mL | INTRAVENOUS | Status: DC
  Administered 2015-03-11: 500 mL via INTRAVENOUS

## 2015-03-10 MED ORDER — OXYCODONE-ACETAMINOPHEN 5-325 MG PO TABS: 2.0000 | ORAL_TABLET | ORAL | Status: DC | PRN

## 2015-03-10 MED ORDER — ONDANSETRON HCL 4 MG/2ML IJ SOLN: 4.0000 mg | Freq: Four times a day (QID) | INTRAMUSCULAR | Status: DC | PRN

## 2015-03-10 MED ORDER — OXYTOCIN 40 UNITS IN LACTATED RINGERS INFUSION - SIMPLE MED: 62.5000 mL/h | INTRAVENOUS | Status: DC

## 2015-03-10 MED ORDER — CITRIC ACID-SODIUM CITRATE 334-500 MG/5ML PO SOLN: 30.0000 mL | ORAL | Status: DC | PRN

## 2015-03-10 MED ORDER — LACTATED RINGERS IV SOLN: 500.0000 mL | INTRAVENOUS | Status: DC | PRN

## 2015-03-10 MED ORDER — FENTANYL CITRATE (PF) 100 MCG/2ML IJ SOLN
50.0000 ug | INTRAMUSCULAR | Status: DC | PRN
  Administered 2015-03-10: 50 ug via INTRAVENOUS
  Administered 2015-03-11: 100 ug via INTRAVENOUS
  Filled 2015-03-10 (×2): qty 2

## 2015-03-10 MED ORDER — LIDOCAINE HCL (PF) 1 % IJ SOLN
30.0000 mL | INTRAMUSCULAR | Status: DC | PRN
  Filled 2015-03-10: qty 30

## 2015-03-10 MED ORDER — LACTATED RINGERS IV SOLN
INTRAVENOUS | Status: DC
  Administered 2015-03-10: 125 mL/h via INTRAVENOUS

## 2015-03-10 MED ORDER — TERBUTALINE SULFATE 1 MG/ML IJ SOLN: 0.2500 mg | Freq: Once | INTRAMUSCULAR | Status: AC | PRN

## 2015-03-10 NOTE — MAU Note (Signed)
Pt has not had any prenatal care. Denies SROM or bleeding. C/o Pelvic pain and pressure on and off since this morning.

## 2015-03-10 NOTE — H&P (Signed)
LABOR ADMISSION HISTORY AND PHYSICAL  Kelly Maldonado is a 34 y.o. female [id number] with IUP at [redacted]w[redacted]d by 31wk sono presenting for SOL. She reports +FMs, No LOF, no VB, no blurry vision, headaches or peripheral edema, and RUQ pain.  She plans on bottle feeding. She request unsure for birth control.  Dating: By 31 wk --->  Estimated Date of Delivery: 03/04/15  Patient did not have any prenatal care during this pregnancy. Upon presentation at the [facility]U, prenatal labs were drawn, many of which are still pending.   Patient denies any chronic medical conditions, including asthma, HTN and diabetes. Is not aware of any complications with this pregnancy.    Prenatal History/Complications:  Past Medical History: Past Medical History  Diagnosis Date  . Medical history non-contributory     Past Surgical History: Past Surgical History  Procedure Laterality Date  . Gallbladder surgery      Obstetrical History: OB History    Gravida Para Term Preterm AB TAB SAB Ectopic Multiple Living   7 6 6  0 0 0 0 0 0 6      Obstetric Comments   Pt. States she delivered her first baby here and two other babies at this hospital. Denies prenatal care until now.       Social History: History   Social History  . Marital Status: Legally Separated    Spouse Name: N/A  . Number of Children: N/A  . Years of Education: N/A   Social History Main Topics  . Smoking status: Never Smoker   . Smokeless tobacco: Not on file  . Alcohol Use: No  . Drug Use: No  . Sexual Activity: Yes   Other Topics Concern  . None   Social History Narrative    Family History: History reviewed. No pertinent family history.  Allergies: No Known Allergies  Prescriptions prior to admission  Medication Sig Dispense Refill Last Dose  . cephALEXin (KEFLEX) 500 MG capsule Take 1 capsule (500 mg total) by mouth 4 (four) times daily. 40 capsule 0   . ferrous sulfate (FERROUSUL) 325 (65 FE) MG tablet Take 1 tablet (325 mg  total) by mouth 2 (two) times daily with a meal. 60 tablet 3   . Prenatal Vit-Fe Fumarate-FA (PRENATAL COMPLETE) 14-0.4 MG TABS Take 1 tablet by mouth every morning. 30 each 12      Review of Systems   All systems reviewed and negative except as stated in HPI  Blood pressure 111/48, pulse 85, temperature 98.4 F (36.9 C), temperature source Oral, resp. rate 18, height 5\' 3"  (1.6 m), weight 78.019 kg (172 lb). General appearance: alert and cooperative Lungs: clear to auscultation bilaterally Heart: regular rate and rhythm Abdomen: soft, non-tender; bowel sounds normal Extremities: Homans sign is negative, no sign of DVT Presentation: cephalic Fetal monitoringBaseline: 130 bpm, Variability: Good {> 6 bpm), Accelerations: Reactive and Decelerations: Absent Uterine activityFrequency: Every 5-7 minutes Dilation: 4 Effacement (%): 70 Station: -3 Exam by:: Elie Confer RN   Prenatal labs: ABO, Rh: --/--/O POS (07/27 1653) Antibody: NEG (07/27 1653) Rubella:  pending RPR: Non Reactive (05/22 0954)  HBsAg: NEGATIVE (05/22 0954)  HIV:   pending GBS:   negative 1 hr Glucola not performed Genetic screening  Not preformed  Anatomy US normal  Prenatal Transfer Tool  Maternal Diabetes: No Genetic Screening: Declined Maternal Ultrasounds/Referrals: Normal Fetal Ultrasounds or other Referrals:  None Maternal Substance Abuse:  No Significant Maternal Medications:  None Significant Maternal Lab Results: Lab values include: Group B  Strep negative  Results for orders placed or performed during the hospital encounter of 03/10/15 (from the past 24 hour(s))  Urinalysis, Routine w reflex microscopic (not at [facility])   Collection Time: 03/10/15  4:45 PM  Result Value Ref Range   Color, Urine YELLOW YELLOW   APPearance HAZY (A) CLEAR   Specific Gravity, Urine >1.030 (H) 1.005 - 1.030   pH 6.0 5.0 - 8.0   Glucose, UA NEGATIVE NEGATIVE mg/dL   Hgb urine dipstick NEGATIVE NEGATIVE   Bilirubin  Urine SMALL (A) NEGATIVE   Ketones, ur >80 (A) NEGATIVE mg/dL   Protein, ur NEGATIVE NEGATIVE mg/dL   Urobilinogen, UA 1.0 0.0 - 1.0 mg/dL   Nitrite NEGATIVE NEGATIVE   Leukocytes, UA NEGATIVE NEGATIVE  CBC   Collection Time: 03/10/15  4:53 PM  Result Value Ref Range   WBC 10.8 (H) 4.0 - 10.5 K/uL   RBC 4.65 3.87 - 5.11 MIL/uL   Hemoglobin 10.1 (L) 12.0 - 15.0 g/dL   HCT 16.1 (L) 09.6 - 04.5 %   MCV 70.5 (L) 78.0 - 100.0 fL   MCH 21.7 (L) 26.0 - 34.0 pg   MCHC 30.8 30.0 - 36.0 g/dL   RDW 40.9 (H) 81.1 - 91.4 %   Platelets 230 150 - 400 K/uL  Differential   Collection Time: 03/10/15  4:53 PM  Result Value Ref Range   Neutrophils Relative % 73 43 - 77 %   Lymphocytes Relative 20 12 - 46 %   Monocytes Relative 5 3 - 12 %   Eosinophils Relative 2 0 - 5 %   Basophils Relative 0 0 - 1 %   Other 0 %   Neutro Abs 7.9 (H) 1.7 - 7.7 K/uL   Lymphs Abs 2.2 0.7 - 4.0 K/uL   Monocytes Absolute 0.5 0.1 - 1.0 K/uL   Eosinophils Absolute 0.2 0.0 - 0.7 K/uL   Basophils Absolute 0.0 0.0 - 0.1 K/uL   RBC Morphology HYPOCHROMIA   Type and screen   Collection Time: 03/10/15  4:53 PM  Result Value Ref Range   ABO/RH(D) O POS    Antibody Screen NEG    Sample Expiration 03/13/2015   Group B strep by PCR   Collection Time: 03/10/15  6:25 PM  Result Value Ref Range   Group B strep by PCR NEGATIVE NEGATIVE    Patient Active Problem List   Diagnosis Date Noted  . UTI (urinary tract infection) in pregnancy in third trimester 01/04/2015  . Anemia affecting pregnancy in third trimester, antepartum   . Late prenatal care affecting pregnancy     Assessment: Kelly Maldonado is a 34 y.o. G7P6006 at [redacted]w[redacted]d here for SOL  #Labor:augment with pitocin #Pain: Epidural upon request #FWB: Cat I #ID:  GBS neg #MOF: bottle #MOC:unknown--wishes to discuss PP #Circ:  Sex of baby unknown   De Hollingshead 03/10/2015, 9:32 PM   CNM attestation:  I have seen and examined this patient; I  agree with above documentation in the resident's note.   Kelly Maldonado is a 34 y.o. [id number] here for IOL due to post term @ 40.6wks, w/ the possibility of being truly post dates >42wks due to [facility] determination by 31wk scan. All previous deliveries vaginal.  PE: BP 111/48 mmHg  Pulse 85  Temp(Src) 98.2 F (36.8 C) (Oral)  Resp 18  Ht 5\' 3"  (1.6 m)  Wt 78.019 kg (172 lb)  BMI 30.48 kg/m2 Gen: calm comfortable, NAD Resp: normal effort, no distress Abd: gravid  ROS, labs, PMH reviewed  Plan: Admit to [employer] Pitocin, and plan AROM once ctx are regular Prenatal labs pending Anticipate SVD  Cam Hai CNM 03/10/2015, 10:54 PM `````Attestation of Attending Supervision of Advanced Practitioner: Evaluation and management procedures were performed by the PA/NP/CNM/OB Fellow under my supervision/collaboration. Chart reviewed and agree with management and plan.  Jolon Degante V 03/11/2015 2:25 AM

## 2015-03-11 ENCOUNTER — Encounter (HOSPITAL_COMMUNITY): Payer: Self-pay | Admitting: Emergency Medicine

## 2015-03-11 DIAGNOSIS — Z3A40 40 weeks gestation of pregnancy: Secondary | ICD-10-CM

## 2015-03-11 DIAGNOSIS — D649 Anemia, unspecified: Secondary | ICD-10-CM

## 2015-03-11 DIAGNOSIS — O0933 Supervision of pregnancy with insufficient antenatal care, third trimester: Secondary | ICD-10-CM

## 2015-03-11 DIAGNOSIS — O9902 Anemia complicating childbirth: Secondary | ICD-10-CM

## 2015-03-11 LAB — RUBELLA SCREEN: Rubella: 13.4 index (ref >0.99)

## 2015-03-11 LAB — HEPATITIS B SURFACE ANTIGEN: Hepatitis B Surface Ag: NEGATIVE

## 2015-03-11 LAB — HIV ANTIBODY (ROUTINE TESTING W REFLEX): HIV SCREEN 4TH GENERATION: NONREACTIVE

## 2015-03-11 LAB — RPR: RPR Ser Ql: NONREACTIVE

## 2015-03-11 MED ORDER — TETANUS-DIPHTH-ACELL PERTUSSIS 5-2.5-18.5 LF-MCG/0.5 IM SUSP
0.5000 mL | Freq: Once | INTRAMUSCULAR | Status: AC
  Administered 2015-03-12: 0.5 mL via INTRAMUSCULAR
  Filled 2015-03-11: qty 0.5

## 2015-03-11 MED ORDER — PRENATAL MULTIVITAMIN CH
1.0000 | ORAL_TABLET | Freq: Every day | ORAL | Status: DC
  Administered 2015-03-11 – 2015-03-12 (×2): 1 via ORAL
  Filled 2015-03-11 (×2): qty 1

## 2015-03-11 MED ORDER — BENZOCAINE-MENTHOL 20-0.5 % EX AERO
1.0000 | INHALATION_SPRAY | CUTANEOUS | Status: DC | PRN
Start: 2015-03-11 — End: 2015-03-12
  Administered 2015-03-11: 1 via TOPICAL
  Filled 2015-03-11: qty 56

## 2015-03-11 MED ORDER — SENNOSIDES-DOCUSATE SODIUM 8.6-50 MG PO TABS
2.0000 | ORAL_TABLET | ORAL | Status: DC
  Administered 2015-03-11: 2 via ORAL
  Filled 2015-03-11: qty 2

## 2015-03-11 MED ORDER — ONDANSETRON HCL 4 MG PO TABS: 4.0000 mg | ORAL_TABLET | ORAL | Status: DC | PRN

## 2015-03-11 MED ORDER — WITCH HAZEL-GLYCERIN EX PADS: 1.0000 "application " | MEDICATED_PAD | CUTANEOUS | Status: DC | PRN

## 2015-03-11 MED ORDER — IBUPROFEN 600 MG PO TABS
600.0000 mg | ORAL_TABLET | Freq: Four times a day (QID) | ORAL | Status: DC
  Administered 2015-03-11 – 2015-03-12 (×6): 600 mg via ORAL
  Filled 2015-03-11 (×6): qty 1

## 2015-03-11 MED ORDER — LANOLIN HYDROUS EX OINT: TOPICAL_OINTMENT | CUTANEOUS | Status: DC | PRN

## 2015-03-11 MED ORDER — DIBUCAINE 1 % RE OINT: 1.0000 "application " | TOPICAL_OINTMENT | RECTAL | Status: DC | PRN

## 2015-03-11 MED ORDER — DIPHENHYDRAMINE HCL 25 MG PO CAPS: 25.0000 mg | ORAL_CAPSULE | Freq: Four times a day (QID) | ORAL | Status: DC | PRN

## 2015-03-11 MED ORDER — ZOLPIDEM TARTRATE 5 MG PO TABS: 5.0000 mg | ORAL_TABLET | Freq: Every evening | ORAL | Status: DC | PRN

## 2015-03-11 MED ORDER — SIMETHICONE 80 MG PO CHEW: 80.0000 mg | CHEWABLE_TABLET | ORAL | Status: DC | PRN

## 2015-03-11 MED ORDER — ACETAMINOPHEN 325 MG PO TABS: 650.0000 mg | ORAL_TABLET | ORAL | Status: DC | PRN

## 2015-03-11 MED ORDER — ONDANSETRON HCL 4 MG/2ML IJ SOLN: 4.0000 mg | INTRAMUSCULAR | Status: DC | PRN

## 2015-03-11 NOTE — Progress Notes (Signed)
I stopped to check on patient's room I ordered her meals, also I assisted Academic librarian with some questions, by Orlan Leavens Spanish Interpreter

## 2015-03-12 DIAGNOSIS — O9902 Anemia complicating childbirth: Secondary | ICD-10-CM

## 2015-03-12 DIAGNOSIS — D649 Anemia, unspecified: Secondary | ICD-10-CM

## 2015-03-12 DIAGNOSIS — Z3A40 40 weeks gestation of pregnancy: Secondary | ICD-10-CM

## 2015-03-12 MED ORDER — IBUPROFEN 600 MG PO TABS: 600.0000 mg | ORAL_TABLET | Freq: Four times a day (QID) | ORAL | Status: DC

## 2015-03-12 NOTE — Discharge Summary (Signed)
Obstetric Discharge Summary Reason for Admission: onset of labor Prenatal Procedures: none Intrapartum Procedures: spontaneous vaginal delivery Postpartum Procedures: none Complications-Operative and Postpartum: none  Delivery Note AROM at 12:45 AM with clear fluid. After ROM, patient dilated to complete quickly and began to push. At 12:58 AM a viable and healthy female was precipitously delivered via Vaginal, Spontaneous Delivery (Presentation: Right Occiput Anterior). APGAR: 9, 9; weight 8 lb 3.9 oz (3740 g).  Placenta status: Intact, Spontaneous. Cord: 3 vessels with the following complications: None. Cord pH: N/A  Anesthesia: None  Episiotomy: None Lacerations: None Suture Repair: none Est. Blood Loss (mL): 50  Mom to postpartum. Baby to Couplet care / Skin to Skin.  Hospital Course:  Active Problems:   Indication for care in labor or delivery   Kelly Maldonado is a 34 y.o. [id number] s/p SVD.  Patient was admitted 7/27.  She has postpartum course that was uncomplicated. The pt feels ready to go home and  will be discharged with outpatient follow-up.   Today: No acute events overnight.  Pt denies problems with ambulating, voiding or po intake.  She denies nausea or vomiting.  Pain is well controlled.  She has had flatus. She has not had bowel movement.  Lochia Small.  Plan for birth control is  no method.  Method of Feeding: bottle.  Physical Exam:  General: alert, cooperative and appears stated age 51: appropriate Uterine Fundus: firm DVT Evaluation: No evidence of DVT seen on physical exam.  H/H: Lab Results  Component Value Date/Time   HGB 10.1* 03/10/2015 04:53 PM   HCT 32.8* 03/10/2015 04:53 PM    Discharge Diagnoses: Term Pregnancy-delivered  Discharge Information: Date: 03/12/2015 Activity: pelvic rest Diet: routine  Medications: PNV and Ibuprofen Breast feeding:  No: bottle Condition: stable Instructions: refer to handout Discharge to:  home       Discharge Instructions    Discharge patient    Complete by:  As directed             Medication List    STOP taking these medications        cephALEXin 500 MG capsule  Commonly known as:  KEFLEX      TAKE these medications        ferrous sulfate 325 (65 FE) MG tablet  Commonly known as:  FERROUSUL  Take 1 tablet (325 mg total) by mouth 2 (two) times daily with a meal.     PRENATAL COMPLETE 14-0.4 MG Tabs  Take 1 tablet by mouth every morning.       Follow-up Information    Follow up with [facility]. Schedule an appointment as soon as possible for a visit in 6 weeks.   Specialty:  Obstetrics and Gynecology   Contact information:   [address] [place] [address] [phone]      Lowanda Foster ,MD 03/12/2015,8:00 AM  OB fellow attestation I have seen and examined this patient and agree with above documentation in the resident's note.   Kelly Maldonado is a 34 y.o. [id number] s/p NSVD.   Pain is well controlled.  Plan for birth control is no method.  Method of Feeding: breast  PE:  BP 103/60 mmHg  Pulse 84  Temp(Src) 98.3 F (36.8 C) (Oral)  Resp 16  Ht 5\' 3"  (1.6 m)  Wt 172 lb (78.019 kg)  BMI 30.48 kg/m2  SpO2 100%  Breastfeeding? Unknown Fundus firm   Recent Labs  03/10/15 1653  HGB 10.1*  HCT  32.8*   Plan: discharge today - postpartum care discussed - both myself and resident discussed contraception and she does not want any method. - f/u clinic in 6 weeks for postpartum visit   Federico Flake, MD 1:07 PM

## 2015-03-12 NOTE — Discharge Instructions (Signed)

## 2015-03-12 NOTE — Clinical Social Work Maternal (Addendum)
  CLINICAL SOCIAL WORK MATERNAL/CHILD NOTE  Patient Details  Name: Kelly Maldonado MRN: [id number] Date of Birth: Mar 17, 1981  Date:  03/12/2015  Clinical Social Worker Initiating Note:  Kelly Books, LCSW Date/ Time Initiated:  03/12/15/0930     Child's Name:  Kelly Maldonado   Legal Guardian:  Kelly Maldonado (mother)  Need for Interpreter:  Spanish  Date of Referral:  03/11/15     Reason for Referral:  Late or No Prenatal Care    Referral Source:  [facility]   Address:  [address] [place], [place] [address]  Phone number:  [phone]   Household Members:  Minor Children, Spouse   Natural Supports (not living in the home):  Immediate Family, Extended Family   Professional Supports: None   Employment: Homemaker   Type of Work:   N/A  Education:     N/A  Surveyor, quantity Resources:  Self-Pay    Other Resources:    None identified  Cultural/Religious Considerations Which May Impact Care:  None reported  Strengths:  Ability to meet basic needs , Home prepared for child    Risk Factors/Current Problems:   1) No prenatal care during the pregnancy. MOB discussed barriers to care included busy scheduling caring for her other children.  Infant's UDS is negative and MDS is pending.  Cognitive State:  Able to Concentrate , Alert , Goal Oriented , Linear Thinking    Mood/Affect:  Euthymic    CSW Assessment:  CSW received request for consult due to MOB not receiving prenatal care during the pregnancy.  CSW completed assessment with telephonic [employer] Interpreter ID [phone].MOB presented as quiet and difficult to engage as her answers to questions were brief. She presented in an euthymic mood, displayed a limited range in affect, but was noted to be bonding with the infant and smiling occasionally.   Per MOB, she has no questions, concerns, or needs as she prepares to discharge home. She stated that she lives at home with her children and the FOB.  MOB  reported that the FOB works in [place], and is often gone for long periods of time. For this reason, she stated that the FOB has arranged for a friend to spend time in their home to provide her with additional support.  MOB reported that she has the home prepared for the infant.   MOB confirmed no prenatal care during the pregnancy. She stated that barriers to accessing care included feelings of responsibility to care for her children. She stated that she has children at home, and reported that she needed to be home since the school bus would frequently not arrive/drop off her children as they were supposed to. MOB confirmed that she has access to transportation, and voiced no barriers to accessing care for the infant postpartum.  MOB verbalized understanding of the hospital drug screen, and denied any substance use during the pregnancy.  MOB denied concerns related to the collection of the infant's urine and meconium.   MOB continued to deny questions, concerns, or needs. She denied need for additional support at the hospital.   CSW Plan/Description:   1)Patient/Family Education : Hospital drug screen policy 2) CSW to monitor infant's drug screens and will notify CPS if there is a positive result. 3)No Further Intervention Required/No Barriers to Discharge    Kelly Maldonado 03/12/2015, 9:53 AM

## 2016-06-23 IMAGING — US US OB COMP +14 WK
1 series · 12 of 28 positions shown · non-contrast
Comparison: none

[Series 1: us ob +14 all · 72 acquisitions, 12 frames shown]
[im 3/72]
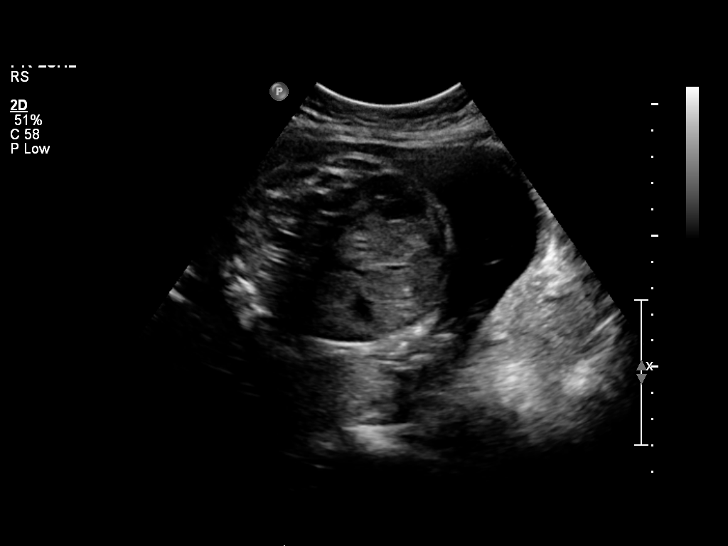
[im 8/72]
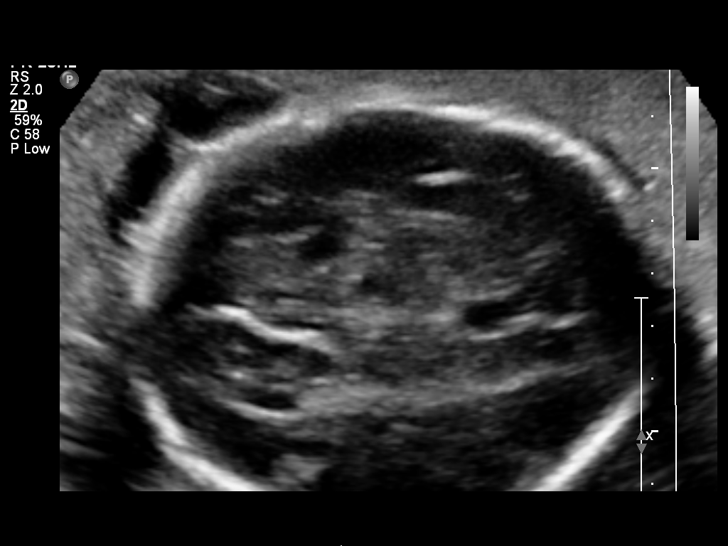
[im 14/72]
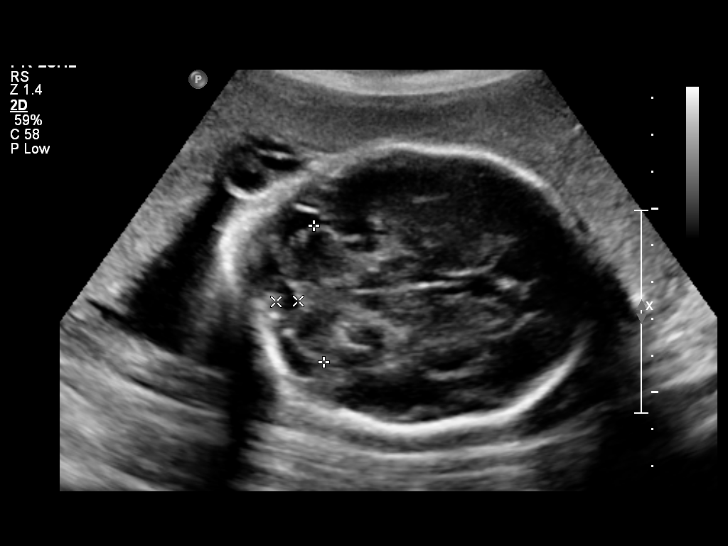
[im 22/72]
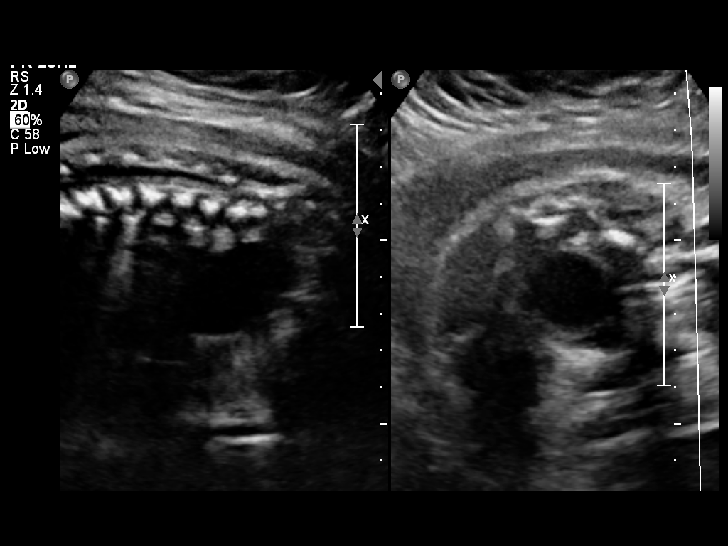
[im 27/72]
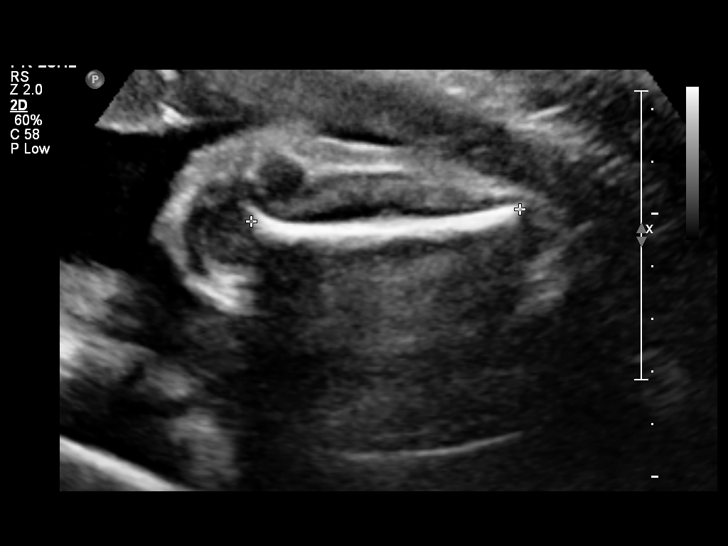
[im 32/72]
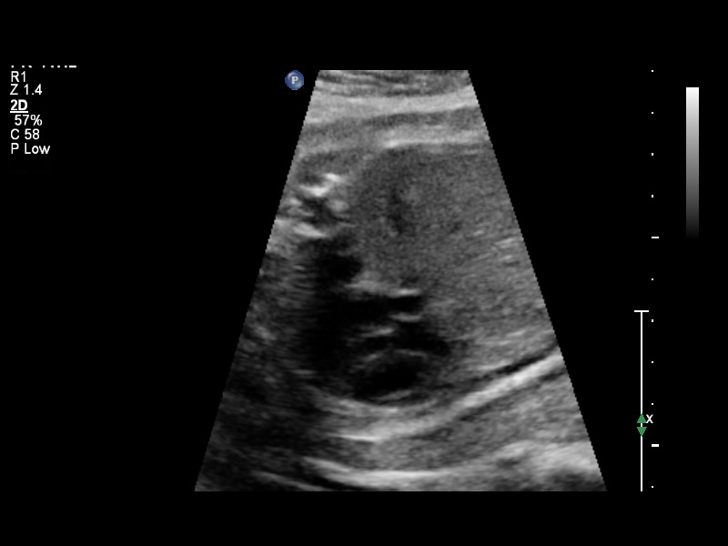
[im 40/72]
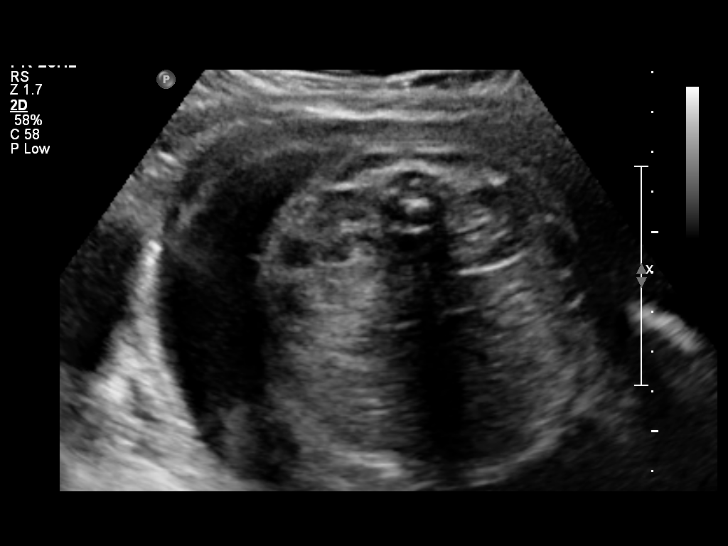
[im 45/72]
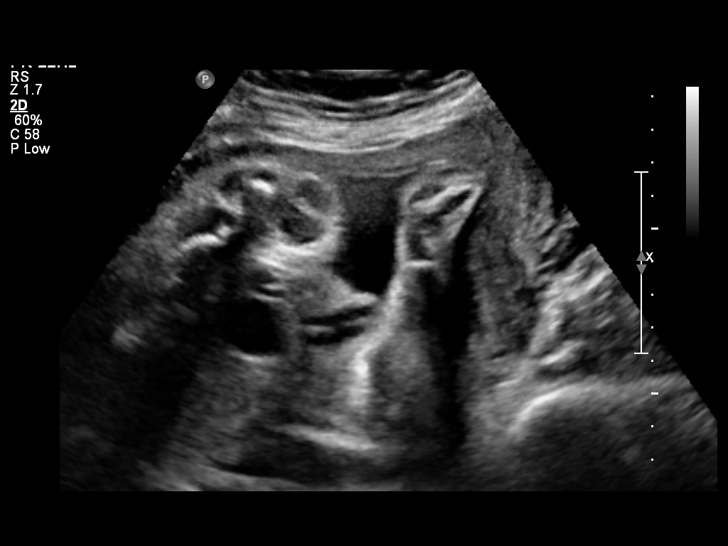
[im 50/72]
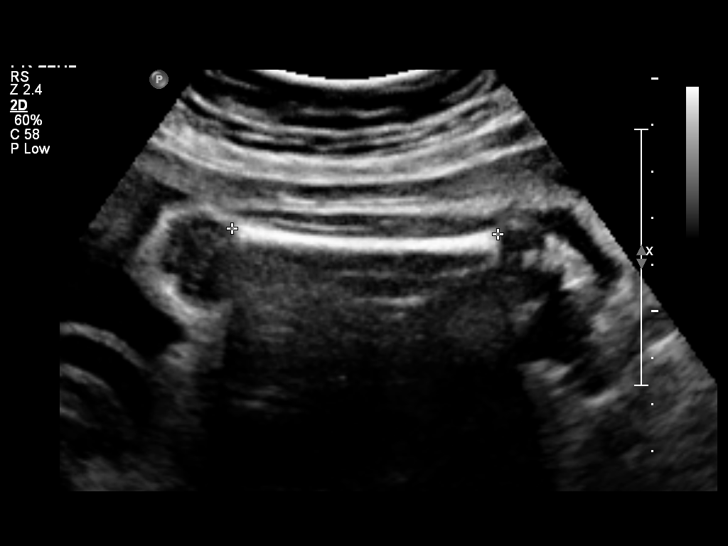
[im 58/72]
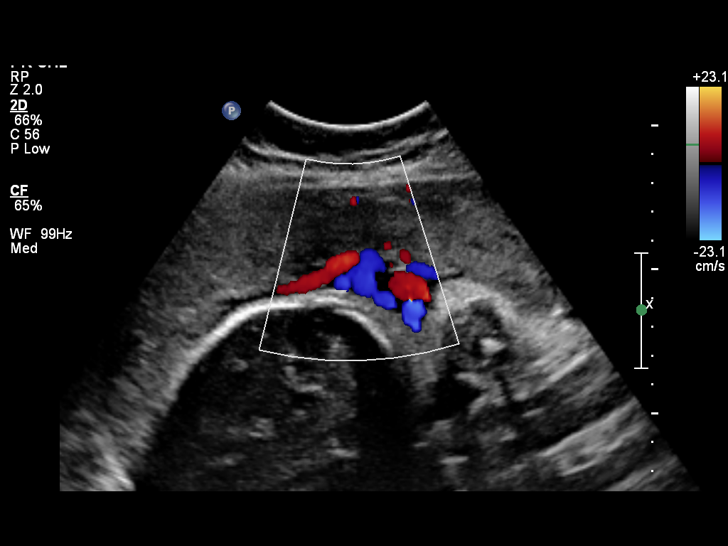
[im 64/72]
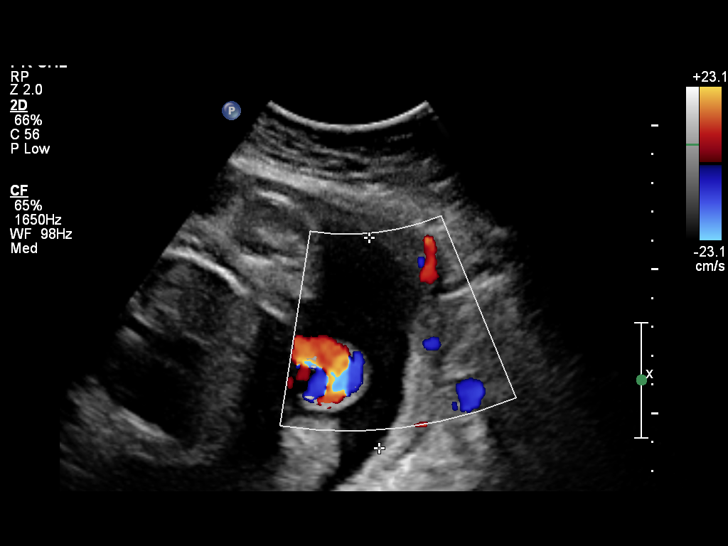
[im 69/72]
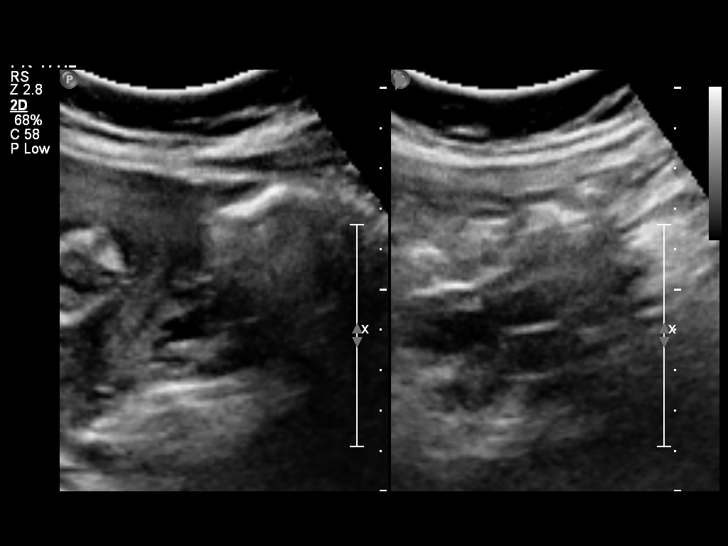

[12 of 28 positions shown; findings below may reference images not displayed]

OBSTETRICS REPORT
(Signed Final 01/03/2015 [DATE])

SROUJI

Service(s) Provided

US OB COMP + 14 WK                                    76805.1
Indications

No or Little Prenatal Care
Uncertain LMP  Establish Gestational Age              Z36
31 weeks gestation of pregnancy
Fetal Evaluation

Num Of Fetuses:    1
Fetal Heart Rate:  158                          bpm
Cardiac Activity:  Observed
Presentation:      Breech, complete
Placenta:          Anterior, above cervical os
P. Cord            Visualized, central
Insertion:

Amniotic Fluid
AFI FV:      Subjectively within normal limits
AFI Sum:     17.44   cm       64  %Tile     Larg Pckt:    7.29  cm
RUQ:   2.85    cm   RLQ:    6.42   cm    LUQ:   7.29    cm   LLQ:    0.88   cm
Biometry

BPD:     78.9  mm     G. Age:  31w 5d                CI:        72.68   70 - 86
FL/HC:      19.1   19.3 -
21.3
HC:     294.3  mm     G. Age:  32w 4d       42  %    HC/AC:      1.06   0.96 -
1.17
AC:     278.5  mm     G. Age:  32w 0d       61  %    FL/BPD:     71.4   71 - 87
FL:      56.3  mm     G. Age:  29w 4d        5  %    FL/AC:      20.2   20 - 24
HUM:     51.1  mm     G. Age:  30w 0d       19  %

Est. FW:    4808  gm    3 lb 13 oz      52  %
Gestational Age

U/S Today:     31w 3d                                        EDD:   03/04/15
Best:          31w 3d     Det. By:  U/S (01/03/15)           EDD:   03/04/15
Anatomy
Cranium:          Appears normal         Aortic Arch:      Appears normal
Fetal Cavum:      Appears normal         Ductal Arch:      Not well visualized
Ventricles:       Appears normal         Diaphragm:        Appears normal
Choroid Plexus:   Appears normal         Stomach:          Appears normal, left
sided
Cerebellum:       Appears normal         Abdomen:          Appears normal
Posterior Fossa:  Appears normal         Abdominal Wall:   Not well visualized
Nuchal Fold:      Not applicable (>20    Cord Vessels:     Appears normal (3
wks GA)                                  vessel cord)
Face:             Not well visualized    Kidneys:          Appear normal
Lips:             Not well visualized    Bladder:          Appears normal
Heart:            Not well visualized    Spine:            Not well visualized
RVOT:             Not well visualized    Lower             Visualized
Extremities:
LVOT:             Not well visualized    Upper             Visualized
Extremities:

Other:  Fetus appears to be a male. Technically difficult due to advanced GA
and fetal position.
Cervix Uterus Adnexa

Cervical Length:    4.2      cm

Cervix:       Normal appearance by transabdominal scan.
Uterus:       No abnormality visualized.
Cul De Sac:   No free fluid seen.
Left Ovary:    Not visualized.
Right Ovary:   Within normal limits.
Impression

SIUP at 45w4d
EFW 52nd%
No dysmorphic features
Limitations as detailed above
no previa
Cervix is long and closed
Recommendations

Recommend follow up attempt to complete survey in 4 weeks
and plot interval growth.

ALEX ZAMUEL with us.  Please do not hesitate to

## 2021-08-14 NOTE — L&D Delivery Note (Addendum)
Delivery Note Called to room to assess patient  progress while pushing. The head delivered and the shoulders were slow to come.  Attemped to grasp posterior axilla, unsuccessful. Baby did deliver after a 1 minute shoulder dystocia and using McRoberts maneuver at 11:18 PM a viable female was delivered via Vaginal, Spontaneous (Presentation: Right Occiput Anterior). APGAR :7 ,9  ; weight pending. Pitocin was started prior to placenta delivery.  Placenta status: Spontaneous, Intact.  Cord: 3 vessels with the following complications: None.  Cord pH: pending  Anesthesia: Epidural Episiotomy: None Lacerations: None Suture Repair:  none Est. Blood Loss (mL): 102  Mom to postpartum.  Baby to Couplet care / Skin to Skin.  Hollace Hayward, SNM 05/25/2022, 11:39 PM   The above was performed under my direct supervision and guidance.

## 2021-09-10 ENCOUNTER — Inpatient Hospital Stay (HOSPITAL_COMMUNITY): Payer: Self-pay

## 2021-09-10 ENCOUNTER — Inpatient Hospital Stay (HOSPITAL_COMMUNITY)
Admission: AD | Admit: 2021-09-10 | Discharge: 2021-09-10 | Disposition: A | Discharge status: Home / Self Care | Payer: Self-pay | Attending: Obstetrics & Gynecology | Admitting: Obstetrics & Gynecology

## 2021-09-10 ENCOUNTER — Encounter (HOSPITAL_COMMUNITY): Payer: Self-pay

## 2021-09-10 ENCOUNTER — Other Ambulatory Visit: Payer: Self-pay

## 2021-09-10 DIAGNOSIS — N8312 Corpus luteum cyst of left ovary: Secondary | ICD-10-CM | POA: Insufficient documentation

## 2021-09-10 DIAGNOSIS — Z3A01 Less than 8 weeks gestation of pregnancy: Secondary | ICD-10-CM | POA: Insufficient documentation

## 2021-09-10 DIAGNOSIS — Z20822 Contact with and (suspected) exposure to covid-19: Secondary | ICD-10-CM | POA: Insufficient documentation

## 2021-09-10 DIAGNOSIS — O3481 Maternal care for other abnormalities of pelvic organs, first trimester: Secondary | ICD-10-CM | POA: Insufficient documentation

## 2021-09-10 DIAGNOSIS — O219 Vomiting of pregnancy, unspecified: Secondary | ICD-10-CM | POA: Insufficient documentation

## 2021-09-10 DIAGNOSIS — R1032 Left lower quadrant pain: Secondary | ICD-10-CM | POA: Insufficient documentation

## 2021-09-10 DIAGNOSIS — O26891 Other specified pregnancy related conditions, first trimester: Secondary | ICD-10-CM | POA: Insufficient documentation

## 2021-09-10 DIAGNOSIS — O26899 Other specified pregnancy related conditions, unspecified trimester: Secondary | ICD-10-CM

## 2021-09-10 DIAGNOSIS — O09521 Supervision of elderly multigravida, first trimester: Secondary | ICD-10-CM | POA: Insufficient documentation

## 2021-09-10 LAB — URINALYSIS, ROUTINE W REFLEX MICROSCOPIC
Bilirubin Urine: NEGATIVE
Glucose, UA: NEGATIVE mg/dL
Ketones, ur: NEGATIVE mg/dL
Nitrite: NEGATIVE
Protein, ur: NEGATIVE mg/dL
Specific Gravity, Urine: 1.02 (ref 1.005–1.030)
pH: 5 (ref 5.0–8.0)

## 2021-09-10 LAB — COMPREHENSIVE METABOLIC PANEL
ALT: 15 U/L (ref 0–44)
AST: 14 U/L — ABNORMAL LOW (ref 15–41)
Albumin: 3.4 g/dL — ABNORMAL LOW (ref 3.5–5.0)
Alkaline Phosphatase: 78 U/L (ref 38–126)
Anion gap: 8 (ref 5–15)
BUN: 7 mg/dL (ref 6–20)
CO2: 20 mmol/L — ABNORMAL LOW (ref 22–32)
Calcium: 8.9 mg/dL (ref 8.9–10.3)
Chloride: 108 mmol/L (ref 98–111)
Creatinine, Ser: 0.52 mg/dL (ref 0.44–1.00)
GFR, Estimated: >60 mL/min (ref >60)
Glucose, Bld: 99 mg/dL (ref 70–99)
Potassium: 3.3 mmol/L — ABNORMAL LOW (ref 3.5–5.1)
Sodium: 136 mmol/L (ref 135–145)
Total Bilirubin: <0.1 mg/dL — ABNORMAL LOW (ref 0.3–1.2)
Total Protein: 6.8 g/dL (ref 6.5–8.1)

## 2021-09-10 LAB — CBC WITH DIFFERENTIAL/PLATELET
Abs Immature Granulocytes: 0.03 10*3/uL (ref 0.00–0.07)
Basophils Absolute: 0 10*3/uL (ref 0.0–0.1)
Basophils Relative: 0 %
Eosinophils Absolute: 0.2 10*3/uL (ref 0.0–0.5)
Eosinophils Relative: 2 %
HCT: 36.2 % (ref 36.0–46.0)
Hemoglobin: 11.2 g/dL — ABNORMAL LOW (ref 12.0–15.0)
Immature Granulocytes: 0 %
Lymphocytes Relative: 25 %
Lymphs Abs: 2.7 10*3/uL (ref 0.7–4.0)
MCH: 21.6 pg — ABNORMAL LOW (ref 26.0–34.0)
MCHC: 30.9 g/dL (ref 30.0–36.0)
MCV: 69.9 fL — ABNORMAL LOW (ref 80.0–100.0)
Monocytes Absolute: 0.8 10*3/uL (ref 0.1–1.0)
Monocytes Relative: 8 %
Neutro Abs: 7.1 10*3/uL (ref 1.7–7.7)
Neutrophils Relative %: 65 %
Platelets: 263 10*3/uL (ref 150–400)
RBC: 5.18 MIL/uL — ABNORMAL HIGH (ref 3.87–5.11)
RDW: 17 % — ABNORMAL HIGH (ref 11.5–15.5)
WBC: 10.9 10*3/uL — ABNORMAL HIGH (ref 4.0–10.5)
nRBC: 0 % (ref 0.0–0.2)

## 2021-09-10 LAB — WET PREP, GENITAL
Clue Cells Wet Prep HPF POC: NONE SEEN
Sperm: NONE SEEN
Trich, Wet Prep: NONE SEEN
WBC, Wet Prep HPF POC: <10 (ref <10)
Yeast Wet Prep HPF POC: NONE SEEN

## 2021-09-10 LAB — RESP PANEL BY RT-PCR (FLU A&B, COVID) ARPGX2
Influenza A by PCR: NEGATIVE
Influenza B by PCR: NEGATIVE
SARS Coronavirus 2 by RT PCR: NEGATIVE

## 2021-09-10 LAB — LIPASE, BLOOD: Lipase: 37 U/L (ref 11–51)

## 2021-09-10 LAB — I-STAT BETA HCG BLOOD, ED (MC, WL, AP ONLY): I-stat hCG, quantitative: >2000 m[IU]/mL — ABNORMAL HIGH (ref <5)

## 2021-09-10 LAB — HCG, QUANTITATIVE, PREGNANCY: hCG, Beta Chain, Quant, S: 23028 m[IU]/mL — ABNORMAL HIGH (ref <5)

## 2021-09-10 MED ORDER — LACTATED RINGERS IV BOLUS
1000.0000 mL | Freq: Once | INTRAVENOUS | Status: AC
Start: 2021-09-10 — End: 2021-09-10
  Administered 2021-09-10: 1000 mL via INTRAVENOUS

## 2021-09-10 MED ORDER — ACETAMINOPHEN 325 MG PO TABS
650.0000 mg | ORAL_TABLET | Freq: Once | ORAL | Status: AC
  Administered 2021-09-10: 650 mg via ORAL
  Filled 2021-09-10: qty 2

## 2021-09-10 MED ORDER — METOCLOPRAMIDE HCL 10 MG PO TABS: 10.0000 mg | ORAL_TABLET | Freq: Three times a day (TID) | ORAL | 0 refills | Status: DC

## 2021-09-10 MED ORDER — ONDANSETRON HCL 4 MG/2ML IJ SOLN
4.0000 mg | Freq: Once | INTRAMUSCULAR | Status: AC
  Administered 2021-09-10: 4 mg via INTRAVENOUS
  Filled 2021-09-10: qty 2

## 2021-09-10 NOTE — MAU Provider Note (Signed)
History     CSN: [id number]  Arrival date and time: 09/10/21 1643   None     Chief Complaint  Patient presents with   Abdominal Pain   Nausea   Emesis   HPI  Kelly Maldonado is a 41 y.o. Female [id number] @ 5844w5d here with LLQ pain and N/V. She reports every day nausea. She has not tried anything over the counter for the symptoms. The symptoms come and go. She denies nausea or vomiting at this time.  The abdominal pain comes and goes. She has 0/10 pain at this time. The pain is worse in the LLQ. She has no vaginal bleeding.   Unexpected pregnancy. Spanish interpretor present for visit.   OB History     Gravida  8   Para  7   Term  7   Preterm  0   AB  0   Living  7      SAB  0   IAB  0   Ectopic  0   Multiple  0   Live Births  2        Obstetric Comments  Pt. States she delivered her first baby here and two other babies at this hospital. Denies prenatal care until now.          Past Medical History:  Diagnosis Date   Medical history non-contributory     Past Surgical History:  Procedure Laterality Date   GALLBLADDER SURGERY      No family history on file.  Social History   Tobacco Use   Smoking status: Never  Substance Use Topics   Alcohol use: No   Drug use: No    Allergies: No Known Allergies  Medications Prior to Admission  Medication Sig Dispense Refill Last Dose   ferrous sulfate (FERROUSUL) 325 (65 FE) MG tablet Take 1 tablet (325 mg total) by mouth 2 (two) times daily with a meal. 60 tablet 3    ibuprofen (ADVIL,MOTRIN) 600 MG tablet Take 1 tablet (600 mg total) by mouth every 6 (six) hours. 30 tablet 0    Prenatal Vit-Fe Fumarate-FA (PRENATAL COMPLETE) 14-0.4 MG TABS Take 1 tablet by mouth every morning. 30 each 12    Results for orders placed or performed during the hospital encounter of 09/10/21 (from the past 48 hour(s))  Comprehensive metabolic panel     Status: Abnormal   Collection Time: 09/10/21  5:56 PM   Result Value Ref Range   Sodium 136 135 - 145 mmol/L   Potassium 3.3 (L) 3.5 - 5.1 mmol/L   Chloride 108 98 - 111 mmol/L   CO2 20 (L) 22 - 32 mmol/L   Glucose, Bld 99 70 - 99 mg/dL    Comment: Glucose reference range applies only to samples taken after fasting for at least 8 hours.   BUN 7 6 - 20 mg/dL   Creatinine, Ser 1.300.52 0.44 - 1.00 mg/dL   Calcium 8.9 8.9 - 86.510.3 mg/dL   Total Protein 6.8 6.5 - 8.1 g/dL   Albumin 3.4 (L) 3.5 - 5.0 g/dL   AST 14 (L) 15 - 41 U/L   ALT 15 0 - 44 U/L   Alkaline Phosphatase 78 38 - 126 U/L   Total Bilirubin <0.1 (L) 0.3 - 1.2 mg/dL   GFR, Estimated >78>60 >46>60 mL/min    Comment: (NOTE) Calculated using the CKD-EPI Creatinine Equation (2021)    Anion gap 8 5 - 15    Comment: Performed at [facility], [address] [place], [place] [address]  CBC with Differential     Status: Abnormal   Collection Time: 09/10/21  5:56 PM  Result Value Ref Range   WBC 10.9 (H) 4.0 - 10.5 K/uL   RBC 5.18 (H) 3.87 - 5.11 MIL/uL   Hemoglobin 11.2 (L) 12.0 - 15.0 g/dL   HCT 60.436.2 54.036.0 - 98.146.0 %   MCV 69.9 (L) 80.0 - 100.0 fL   MCH 21.6 (L) 26.0 - 34.0 pg   MCHC 30.9 30.0 - 36.0 g/dL   RDW 19.117.0 (H) 47.811.5 - 29.515.5 %   Platelets 263 150 - 400 K/uL   nRBC 0.0 0.0 - 0.2 %   Neutrophils Relative % 65 %   Neutro Abs 7.1 1.7 - 7.7 K/uL   Lymphocytes Relative 25 %   Lymphs Abs 2.7 0.7 - 4.0 K/uL   Monocytes Relative 8 %   Monocytes Absolute 0.8 0.1 - 1.0 K/uL   Eosinophils Relative 2 %   Eosinophils Absolute 0.2 0.0 - 0.5 K/uL   Basophils Relative 0 %   Basophils Absolute 0.0 0.0 - 0.1 K/uL   Immature Granulocytes 0 %   Abs Immature Granulocytes 0.03 0.00 - 0.07 K/uL    Comment: Performed at [facility], [address] [place], [place] [address]  Lipase, blood     Status: None   Collection Time: 09/10/21  5:56 PM  Result Value Ref Range   Lipase 37 11 - 51 U/L    Comment: Performed at [facility], [address] [place], [place] [address]  Resp  Panel by RT-PCR (Flu A&B, Covid) Nasopharyngeal Swab     Status: None   Collection Time: 09/10/21  6:05 PM   Specimen: Nasopharyngeal Swab; Nasopharyngeal(NP) swabs in vial transport medium  Result Value Ref Range   SARS Coronavirus 2 by RT PCR NEGATIVE NEGATIVE    Comment: (NOTE) SARS-CoV-2 target nucleic acids are NOT DETECTED.  The SARS-CoV-2 RNA is generally detectable in upper respiratory specimens during the acute phase of infection. The lowest concentration of SARS-CoV-2 viral copies this assay can detect is 138 copies/mL. A negative result does not preclude SARS-Cov-2 infection and should not be used as the sole basis for treatment or other patient management decisions. A negative result may occur with  improper specimen collection/handling, submission of specimen other than nasopharyngeal swab, presence of viral mutation(s) within the areas targeted by this assay, and inadequate number of viral copies(<138 copies/mL). A negative result must be combined with clinical observations, patient history, and epidemiological information. The expected result is Negative.  Fact Sheet for Patients:  BloggerCourse.comhttps://www.fda.gov/media/152166/download  Fact Sheet for Healthcare Providers:  SeriousBroker.ithttps://www.fda.gov/media/152162/download  This test is no t yet approved or cleared by the [place] FDA and  has been authorized for detection and/or diagnosis of SARS-CoV-2 by FDA under an Emergency Use Authorization (EUA). This EUA will remain  in effect (meaning this test can be used) for the duration of the COVID-19 declaration under Section 564(b)(1) of the Act, 21 U.S.C.section 360bbb-3(b)(1), unless the authorization is terminated  or revoked sooner.       Influenza A by PCR NEGATIVE NEGATIVE   Influenza B by PCR NEGATIVE NEGATIVE    Comment: (NOTE) The Xpert Xpress SARS-CoV-2/FLU/RSV plus assay is intended as an aid in the diagnosis of influenza from Nasopharyngeal swab specimens  and should not be used as a sole basis for treatment. Nasal washings and aspirates are unacceptable for Xpert Xpress SARS-CoV-2/FLU/RSV testing.  Fact Sheet for Patients: BloggerCourse.com  Fact Sheet for Healthcare Providers: SeriousBroker.it  This test is not yet approved or cleared by the [place] FDA and has been authorized for detection and/or diagnosis of SARS-CoV-2 by FDA under an Emergency Use Authorization (EUA). This EUA will remain in effect (meaning this test can be used) for the duration of the COVID-19 declaration under Section 564(b)(1) of the Act, 21 U.S.C. section 360bbb-3(b)(1), unless the authorization is terminated or revoked.  Performed at [facility], [address], [place], [place] [address]   I-Stat Beta hCG blood, ED ([facility], [facility], [facility] only)     Status: Abnormal   Collection Time: 09/10/21  6:20 PM  Result Value Ref Range   I-stat hCG, quantitative >2,000.0 (H) <5 mIU/mL   Comment 3            Comment:   GEST. AGE      CONC.  (mIU/mL)   <=1 WEEK        5 - 50     2 WEEKS       50 - 500     3 WEEKS       100 - 10,000     4 WEEKS     1,000 - 30,000        FEMALE AND NON-PREGNANT FEMALE:     LESS THAN 5 mIU/mL   Urinalysis, Routine w reflex microscopic     Status: Abnormal   Collection Time: 09/10/21  8:55 PM  Result Value Ref Range   Color, Urine YELLOW YELLOW   APPearance CLOUDY (A) CLEAR   Specific Gravity, Urine 1.020 1.005 - 1.030   pH 5.0 5.0 - 8.0   Glucose, UA NEGATIVE NEGATIVE mg/dL   Hgb urine dipstick SMALL (A) NEGATIVE   Bilirubin Urine NEGATIVE NEGATIVE   Ketones, ur NEGATIVE NEGATIVE mg/dL   Protein, ur NEGATIVE NEGATIVE mg/dL   Nitrite NEGATIVE NEGATIVE   Leukocytes,Ua LARGE (A) NEGATIVE   RBC / HPF 6-10 0 - 5 RBC/hpf   WBC, UA 11-20 0 - 5 WBC/hpf   Bacteria, UA FEW (A) NONE SEEN   Squamous Epithelial / LPF 21-50 0 - 5   Mucus PRESENT     Comment: Performed at [facility], [address], [place], [place] [address]  hCG, quantitative, pregnancy     Status: Abnormal   Collection Time: 09/10/21  8:57 PM  Result Value Ref Range   hCG, Beta Chain, Quant, S 23,028 (H) <5 mIU/mL    Comment:          GEST. AGE      CONC.  (mIU/mL)   <=1 WEEK        5 - 50     2 WEEKS       50 - 500     3 WEEKS       100 - 10,000     4 WEEKS     1,000 - 30,000     5 WEEKS     3,500 - 115,000   6-8 WEEKS     12,000 - 270,000    12 WEEKS     15,000 - 220,000        FEMALE AND NON-PREGNANT FEMALE:     LESS THAN 5 mIU/mL Performed at [facility], [address] [place], [place] [address]   Wet prep, genital     Status: None   Collection Time: 09/10/21  8:57 PM  Specimen: PATH Cytology Cervicovaginal Ancillary Only  Result Value Ref Range   Yeast Wet Prep HPF POC NONE SEEN NONE SEEN   Trich, Wet Prep NONE SEEN NONE SEEN   Clue Cells Wet Prep HPF POC NONE SEEN NONE SEEN   WBC, Wet Prep HPF POC <10 <10   Sperm NONE SEEN     Comment: Performed at [facility], [address], [place], [place] [address]     US OB LESS THAN 14 WEEKS WITH [place] TRANSVAGINAL  Result Date: 09/10/2021 CLINICAL DATA:  Left lower quadrant pain with cramping. EXAM: OBSTETRIC <14 WK [place] AND TRANSVAGINAL OB US TECHNIQUE: Both transabdominal and transvaginal ultrasound examinations were performed for complete evaluation of the gestation as well as the maternal uterus, adnexal regions, and pelvic cul-de-sac. Transvaginal technique was performed to assess early pregnancy. COMPARISON:  None. FINDINGS: Intrauterine gestational sac: Single Yolk sac:  Visualized. Embryo:  Visualized. Cardiac Activity: Visualized. Heart Rate: 99 bpm CRL:  2.5 mm   5 w   5 d                  [place] EDC: 05/08/2022 Subchorionic hemorrhage:  None visualized. Maternal uterus/adnexae: Corpus luteum cyst noted in the left ovary. The ovaries otherwise appear within normal limits. There is no free fluid in the pelvis. There is a  trace amount of fluid within the endometrial. IMPRESSION: 1. Single live intrauterine gestation measuring 5 weeks 5 days by length. 2. Heart rate is lower limits of normal. Recommended short-term follow-up ultrasound to confirm viability. 3. Small amount of fluid in the endometrium. Electronically Signed   By: Darliss Cheney M.D.   On: 09/10/2021 22:01     Review of Systems  Constitutional:  Negative for fever.  Gastrointestinal:  Positive for nausea and vomiting. Negative for abdominal pain.  Genitourinary:  Negative for vaginal bleeding and vaginal discharge.  Physical Exam   Blood pressure 113/64, pulse 72, temperature 98.2 F (36.8 C), temperature source Oral, resp. rate 18, weight 86.8 kg, last menstrual period 08/01/2021, SpO2 100 %, unknown if currently breastfeeding.  Physical Exam Vitals and nursing note reviewed.  Constitutional:      General: She is not in acute distress.    Appearance: She is well-developed. She is not ill-appearing, toxic-appearing or diaphoretic.  Abdominal:     Tenderness: There is abdominal tenderness in the left lower quadrant.  Skin:    General: Skin is warm.  Neurological:     Mental Status: She is alert and oriented to person, place, and time.   MAU Course  Procedures  MDM  Urine culture pending Wet prep & GC HIV, CBC, Hcg, ABO US OB transvaginal    Assessment and Plan   A:  1. Nausea and vomiting in pregnancy   2. Abdominal cramping affecting pregnancy   3. [redacted] weeks gestation of pregnancy      P:  DC home Return to MAU if symptoms worsen Start prenatal care Start prenatal vitamins Reviewed [place] in detail including corpus luteal cyst  Deyani Hegarty, Harolyn Rutherford, NP 09/10/2021 10:24 PM

## 2021-09-10 NOTE — ED Notes (Signed)
ED Provider at bedside. 

## 2021-09-10 NOTE — MAU Note (Signed)
Pt transferred from the ED with c/o of N/V and left ABD pain. Pt reports that the N/V was tx in the ED and she feels better with no N/V since. Pt reports some abd pain that feels like cramping just on the left lower side. Pt was tx with Tylenol in the ED and gave pt some pain relief. Pt denies VB, abnormal discharge and LOF.   LMP: 08/01/2021 EDC: 05/08/2022

## 2021-09-10 NOTE — ED Notes (Signed)
Transport called, will be transferred to MAU shortly.

## 2021-09-10 NOTE — ED Provider Notes (Signed)
[facility]EMERG[facility] DEPT [facility] Emergency Department Provider Note MRN:  [id number]  Arrival date & time: 09/10/21     Chief Complaint   Abdominal Pain, Nausea, and Emesis   History of Present Illness   Kelly Maldonado is a 41 y.o. year-old female with no significant past medical history presenting to the ED with chief complaint of nausea headache and abdominal pain.  The patient states that she has had continuous headache and nausea for the past week.  During that time she has had mild abdominal pain but the abdominal pain has worsened over the past 2 days.  She states that her abdominal pain is bilateral and located in her pelvis.  The pain was nonradiating.  It is not associated with dysuria, vaginal discharge, chest pain, shortness of breath.  He does however associated with chills.  Patient states that she has had no fevers.  The patient states that she is sexually active and does not frequently use contraception.  Review of Systems  A thorough review of systems was obtained and all systems are negative except as noted in the HPI and PMH.   Patient's Health History    Past Medical History:  Diagnosis Date   Medical history non-contributory     Past Surgical History:  Procedure Laterality Date   GALLBLADDER SURGERY      No family history on file.  Social History   Socioeconomic History   Marital status: Legally Separated    Spouse name: Not on file   Number of children: Not on file   Years of education: Not on file   Highest education level: Not on file  Occupational History   Not on file  Tobacco Use   Smoking status: Never   Smokeless tobacco: Not on file  Substance and Sexual Activity   Alcohol use: No   Drug use: No   Sexual activity: Yes  Other Topics Concern   Not on file  Social History Narrative   Not on file   Social Determinants of Health   Financial Resource Strain: Not on file  Food Insecurity: Not on file  Transportation Needs: Not  on file  Physical Activity: Not on file  Stress: Not on file  Social Connections: Not on file  Intimate Partner Violence: Not on file     Physical Exam   Physical Exam Constitutional:      Appearance: She is well-developed. She is not ill-appearing.  HENT:     Head: Normocephalic and atraumatic.     Right Ear: External ear normal.     Left Ear: External ear normal.     Nose: Nose normal.  Cardiovascular:     Rate and Rhythm: Normal rate and regular rhythm.     Pulses: Normal pulses.     Heart sounds: Normal heart sounds.  Pulmonary:     Effort: Pulmonary effort is normal.     Breath sounds: Normal breath sounds.  Abdominal:     General: Abdomen is flat.     Palpations: Abdomen is soft.     Tenderness: There is abdominal tenderness in the right lower quadrant, suprapubic area and left lower quadrant. There is guarding.     Hernia: No hernia is present.  Musculoskeletal:     Cervical back: Neck supple.  Skin:    General: Skin is warm and dry.  Neurological:     General: No focal deficit present.     Mental Status: She is alert and oriented to person, place, and time.  Diagnostic and Interventional Summary    Labs Reviewed  URINALYSIS, ROUTINE W REFLEX MICROSCOPIC - Abnormal; Notable for the following components:      Result Value   APPearance CLOUDY (*)    Hgb urine dipstick SMALL (*)    Leukocytes,Ua LARGE (*)    Bacteria, UA FEW (*)    All other components within normal limits  COMPREHENSIVE METABOLIC PANEL - Abnormal; Notable for the following components:   Potassium 3.3 (*)    CO2 20 (*)    Albumin 3.4 (*)    AST 14 (*)    Total Bilirubin <0.1 (*)    All other components within normal limits  CBC WITH DIFFERENTIAL/PLATELET - Abnormal; Notable for the following components:   WBC 10.9 (*)    RBC 5.18 (*)    Hemoglobin 11.2 (*)    MCV 69.9 (*)    MCH 21.6 (*)    RDW 17.0 (*)    All other components within normal limits  HCG, QUANTITATIVE, PREGNANCY  - Abnormal; Notable for the following components:   hCG, Beta Chain, Quant, S 23,028 (*)    All other components within normal limits  I-STAT BETA HCG BLOOD, ED ([facility], [facility], [facility] ONLY) - Abnormal; Notable for the following components:   I-stat hCG, quantitative >2,000.0 (*)    All other components within normal limits  RESP PANEL BY RT-PCR (FLU A&B, COVID) ARPGX2  WET PREP, GENITAL  CULTURE, OB URINE  LIPASE, BLOOD  GC/CHLAMYDIA PROBE AMP ([place]) NOT AT [facility]    US OB LESS THAN 14 WEEKS WITH OB TRANSVAGINAL  Final Result      Medications  ondansetron (ZOFRAN) injection 4 mg (4 mg Intravenous Given 09/10/21 1815)  lactated ringers bolus 1,000 mL (0 mLs Intravenous Stopped 09/10/21 1950)  acetaminophen (TYLENOL) tablet 650 mg (650 mg Oral Given 09/10/21 1815)     Procedures  /  Critical Care Procedures  ED Course and Medical Decision Making  Initial Impression and Ddx 41 year old female presents to emergency department for evaluation of nausea headache and pelvic pain.  Differential diagnosis includes is not limited to the following: Ectopic pregnancy, appendicitis, cystitis, nephrolithiasis, pyelonephritis.  Had originally plan to obtain labs and CT imaging however the patient's beta-hCG returned and it was greater than 2000.  After receiving this lab result I contacted the on-call OB/GYN and requested that the patient be evaluated under their care.  They agreed to this plan.  Handoff was given to Dr. Robley Fries NP And the patient was transferred to the OB/GYN suite.  Interpretation of Diagnostics I personally reviewed the Cardiac Monitor and my interpretation is as follows: Normal sinus rhythm    Beta-hCG greater than 2000  Patient Reassessment and Ultimate Disposition/Management The patient was in agreement this plan.  Disposition per OB  Patient management required discussion with the following services or consulting groups:  None  Complexity of Problems Addressed Acute illness  or injury that poses threat of life of bodily function    Final Clinical Impressions(s) / ED Diagnoses     ICD-10-CM   1. Nausea and vomiting in pregnancy  O21.9 Discharge patient    2. Abdominal cramping affecting pregnancy  O26.899 US OB LESS THAN 14 WEEKS WITH OB TRANSVAGINAL   R10.9 US OB LESS THAN 14 WEEKS WITH OB TRANSVAGINAL    Discharge patient    3. [redacted] weeks gestation of pregnancy  Z3A.01 Discharge patient      ED Discharge Orders  Ordered    Discharge patient        09/10/21 2216    metoCLOPramide (REGLAN) 10 MG tablet  3 times daily before meals & bedtime        09/10/21 2216             Discharge Instructions Discussed with and Provided to Patient:   Discharge Instructions   None       Camila LiMcIlwain, Tamila Gaulin S, MD 09/10/21 2224    Milagros Lollykstra, Richard S, MD 09/11/21 [phone]

## 2021-09-10 NOTE — ED Triage Notes (Signed)
N/V x 1 week, last time vomited was 0300, still with nausea, c/o pain lower abdomen described as cramps, 2 days ago had fever and is now also experiencing HA.

## 2021-09-12 LAB — GC/CHLAMYDIA PROBE AMP (~~LOC~~) NOT AT ARMC
Chlamydia: NEGATIVE
Comment: NEGATIVE
Comment: NORMAL
Neisseria Gonorrhea: NEGATIVE

## 2021-09-12 LAB — CULTURE, OB URINE

## 2022-05-25 ENCOUNTER — Inpatient Hospital Stay (HOSPITAL_BASED_OUTPATIENT_CLINIC_OR_DEPARTMENT_OTHER): Payer: Medicaid Other

## 2022-05-25 ENCOUNTER — Inpatient Hospital Stay (HOSPITAL_COMMUNITY): Payer: Medicaid Other | Admitting: Anesthesiology

## 2022-05-25 ENCOUNTER — Other Ambulatory Visit: Payer: Self-pay

## 2022-05-25 ENCOUNTER — Inpatient Hospital Stay (HOSPITAL_COMMUNITY)
Admission: AD | Admit: 2022-05-25 | Discharge: 2022-05-27 | DRG: 807 | Disposition: A | Discharge status: Home / Self Care | Payer: Medicaid Other | Attending: Obstetrics & Gynecology | Admitting: Obstetrics & Gynecology

## 2022-05-25 ENCOUNTER — Encounter (HOSPITAL_COMMUNITY): Payer: Self-pay | Admitting: Obstetrics & Gynecology

## 2022-05-25 DIAGNOSIS — Z3A42 42 weeks gestation of pregnancy: Secondary | ICD-10-CM | POA: Diagnosis not present

## 2022-05-25 DIAGNOSIS — O481 Prolonged pregnancy: Principal | ICD-10-CM | POA: Diagnosis present

## 2022-05-25 DIAGNOSIS — O0933 Supervision of pregnancy with insufficient antenatal care, third trimester: Secondary | ICD-10-CM | POA: Diagnosis not present

## 2022-05-25 DIAGNOSIS — O09523 Supervision of elderly multigravida, third trimester: Secondary | ICD-10-CM | POA: Diagnosis not present

## 2022-05-25 DIAGNOSIS — O480 Post-term pregnancy: Secondary | ICD-10-CM | POA: Diagnosis present

## 2022-05-25 DIAGNOSIS — Z23 Encounter for immunization: Secondary | ICD-10-CM

## 2022-05-25 LAB — URINALYSIS, ROUTINE W REFLEX MICROSCOPIC
Bilirubin Urine: NEGATIVE
Glucose, UA: NEGATIVE mg/dL
Hgb urine dipstick: NEGATIVE
Ketones, ur: 80 mg/dL — AB
Leukocytes,Ua: NEGATIVE
Nitrite: NEGATIVE
Protein, ur: 30 mg/dL — AB
Specific Gravity, Urine: 1.023 (ref 1.005–1.030)
pH: 5 (ref 5.0–8.0)

## 2022-05-25 LAB — CBC
HCT: 34.4 % — ABNORMAL LOW (ref 36.0–46.0)
Hemoglobin: 10.4 g/dL — ABNORMAL LOW (ref 12.0–15.0)
MCH: 20.8 pg — ABNORMAL LOW (ref 26.0–34.0)
MCHC: 30.2 g/dL (ref 30.0–36.0)
MCV: 68.9 fL — ABNORMAL LOW (ref 80.0–100.0)
Platelets: 241 10*3/uL (ref 150–400)
RBC: 4.99 MIL/uL (ref 3.87–5.11)
RDW: 19.5 % — ABNORMAL HIGH (ref 11.5–15.5)
WBC: 12.5 10*3/uL — ABNORMAL HIGH (ref 4.0–10.5)
nRBC: 0 % (ref 0.0–0.2)

## 2022-05-25 LAB — COMPREHENSIVE METABOLIC PANEL
ALT: 11 U/L (ref 0–44)
AST: 17 U/L (ref 15–41)
Albumin: 2.7 g/dL — ABNORMAL LOW (ref 3.5–5.0)
Alkaline Phosphatase: 156 U/L — ABNORMAL HIGH (ref 38–126)
Anion gap: 8 (ref 5–15)
BUN: <5 mg/dL — ABNORMAL LOW (ref 6–20)
CO2: 16 mmol/L — ABNORMAL LOW (ref 22–32)
Calcium: 8.6 mg/dL — ABNORMAL LOW (ref 8.9–10.3)
Chloride: 112 mmol/L — ABNORMAL HIGH (ref 98–111)
Creatinine, Ser: 0.43 mg/dL — ABNORMAL LOW (ref 0.44–1.00)
GFR, Estimated: >60 mL/min (ref >60)
Glucose, Bld: 94 mg/dL (ref 70–99)
Potassium: 3.1 mmol/L — ABNORMAL LOW (ref 3.5–5.1)
Sodium: 136 mmol/L (ref 135–145)
Total Bilirubin: 0.4 mg/dL (ref 0.3–1.2)
Total Protein: 6.7 g/dL (ref 6.5–8.1)

## 2022-05-25 LAB — TYPE AND SCREEN
ABO/RH(D): O POS
Antibody Screen: NEGATIVE

## 2022-05-25 LAB — GROUP B STREP BY PCR: Group B strep by PCR: NEGATIVE

## 2022-05-25 LAB — RAPID HIV SCREEN (HIV 1/2 AB+AG)
HIV 1/2 Antibodies: NONREACTIVE
HIV-1 P24 Antigen - HIV24: NONREACTIVE

## 2022-05-25 LAB — OB RESULTS CONSOLE RUBELLA ANTIBODY, IGM: Rubella: IMMUNE

## 2022-05-25 LAB — OB RESULTS CONSOLE GBS: GBS: NEGATIVE

## 2022-05-25 LAB — HEPATITIS B SURFACE ANTIGEN: Hepatitis B Surface Ag: NONREACTIVE

## 2022-05-25 LAB — OB RESULTS CONSOLE HIV ANTIBODY (ROUTINE TESTING): HIV: NONREACTIVE

## 2022-05-25 LAB — PROTEIN / CREATININE RATIO, URINE
Creatinine, Urine: 161 mg/dL
Protein Creatinine Ratio: 0.14 mg/mg{Cre} (ref 0.00–0.15)
Total Protein, Urine: 23 mg/dL

## 2022-05-25 LAB — HEPATITIS C ANTIBODY: HCV Ab: NONREACTIVE

## 2022-05-25 LAB — OB RESULTS CONSOLE HEPATITIS B SURFACE ANTIGEN: Hepatitis B Surface Ag: NEGATIVE

## 2022-05-25 MED ORDER — FENTANYL CITRATE (PF) 100 MCG/2ML IJ SOLN
100.0000 ug | Freq: Once | INTRAMUSCULAR | Status: AC
  Administered 2022-05-25: 100 ug via INTRAVENOUS
  Filled 2022-05-25: qty 2

## 2022-05-25 MED ORDER — FENTANYL-BUPIVACAINE-NACL 0.5-0.125-0.9 MG/250ML-% EP SOLN
12.0000 mL/h | EPIDURAL | Status: DC | PRN
  Filled 2022-05-25: qty 250

## 2022-05-25 MED ORDER — FENTANYL CITRATE (PF) 100 MCG/2ML IJ SOLN
INTRAMUSCULAR | Status: AC
  Filled 2022-05-25: qty 2

## 2022-05-25 MED ORDER — ACETAMINOPHEN 325 MG PO TABS: 650.0000 mg | ORAL_TABLET | ORAL | Status: DC | PRN

## 2022-05-25 MED ORDER — FERROUS SULFATE 325 (65 FE) MG PO TABS
325.0000 mg | ORAL_TABLET | Freq: Two times a day (BID) | ORAL | Status: DC
Start: 2022-05-25 — End: 2022-05-25

## 2022-05-25 MED ORDER — DIPHENHYDRAMINE HCL 50 MG/ML IJ SOLN: 12.5000 mg | INTRAMUSCULAR | Status: DC | PRN

## 2022-05-25 MED ORDER — FLEET ENEMA 7-19 GM/118ML RE ENEM: 1.0000 | ENEMA | RECTAL | Status: DC | PRN

## 2022-05-25 MED ORDER — FENTANYL-BUPIVACAINE-NACL 0.5-0.125-0.9 MG/250ML-% EP SOLN
EPIDURAL | Status: DC | PRN
  Administered 2022-05-25: 12 mL/h via EPIDURAL

## 2022-05-25 MED ORDER — BUPIVACAINE HCL (PF) 0.25 % IJ SOLN
INTRAMUSCULAR | Status: DC | PRN
  Administered 2022-05-25: 8 mL via EPIDURAL

## 2022-05-25 MED ORDER — PHENYLEPHRINE 80 MCG/ML (10ML) SYRINGE FOR IV PUSH (FOR BLOOD PRESSURE SUPPORT): 80.0000 ug | PREFILLED_SYRINGE | INTRAVENOUS | Status: DC | PRN

## 2022-05-25 MED ORDER — OXYTOCIN-SODIUM CHLORIDE 30-0.9 UT/500ML-% IV SOLN
2.5000 [IU]/h | INTRAVENOUS | Status: DC
  Administered 2022-05-25: 2.5 [IU]/h via INTRAVENOUS
  Filled 2022-05-25: qty 500

## 2022-05-25 MED ORDER — FENTANYL CITRATE (PF) 100 MCG/2ML IJ SOLN
INTRAMUSCULAR | Status: DC | PRN
  Administered 2022-05-25: 100 ug via EPIDURAL

## 2022-05-25 MED ORDER — ONDANSETRON HCL 4 MG/2ML IJ SOLN: 4.0000 mg | Freq: Four times a day (QID) | INTRAMUSCULAR | Status: DC | PRN

## 2022-05-25 MED ORDER — SOD CITRATE-CITRIC ACID 500-334 MG/5ML PO SOLN: 30.0000 mL | ORAL | Status: DC | PRN

## 2022-05-25 MED ORDER — PRENATAL COMPLETE 14-0.4 MG PO TABS: 1.0000 | ORAL_TABLET | Freq: Every morning | ORAL | Status: DC

## 2022-05-25 MED ORDER — OXYTOCIN BOLUS FROM INFUSION
333.0000 mL | Freq: Once | INTRAVENOUS | Status: AC
  Administered 2022-05-25: 333 mL via INTRAVENOUS

## 2022-05-25 MED ORDER — FERROUS SULFATE 325 (65 FE) MG PO TABS
325.0000 mg | ORAL_TABLET | ORAL | Status: DC
  Administered 2022-05-25: 325 mg via ORAL
  Filled 2022-05-25: qty 1

## 2022-05-25 MED ORDER — EPHEDRINE 5 MG/ML INJ: 10.0000 mg | INTRAVENOUS | Status: DC | PRN

## 2022-05-25 MED ORDER — LIDOCAINE HCL (PF) 1 % IJ SOLN
INTRAMUSCULAR | Status: DC | PRN
  Administered 2022-05-25: 10 mL via EPIDURAL
  Administered 2022-05-25: 2 mL via EPIDURAL

## 2022-05-25 MED ORDER — PRENATAL MULTIVITAMIN CH: 1.0000 | ORAL_TABLET | Freq: Every day | ORAL | Status: DC

## 2022-05-25 MED ORDER — LACTATED RINGERS IV SOLN: 500.0000 mL | Freq: Once | INTRAVENOUS | Status: DC

## 2022-05-25 MED ORDER — LIDOCAINE HCL (PF) 1 % IJ SOLN: 30.0000 mL | INTRAMUSCULAR | Status: DC | PRN

## 2022-05-25 MED ORDER — OXYCODONE-ACETAMINOPHEN 5-325 MG PO TABS: 1.0000 | ORAL_TABLET | ORAL | Status: DC | PRN

## 2022-05-25 MED ORDER — OXYCODONE-ACETAMINOPHEN 5-325 MG PO TABS: 2.0000 | ORAL_TABLET | ORAL | Status: DC | PRN

## 2022-05-25 MED ORDER — LACTATED RINGERS IV SOLN: 500.0000 mL | INTRAVENOUS | Status: DC | PRN

## 2022-05-25 MED ORDER — POTASSIUM CHLORIDE 20 MEQ PO PACK
20.0000 meq | PACK | Freq: Two times a day (BID) | ORAL | Status: DC
  Filled 2022-05-25 (×2): qty 1

## 2022-05-25 MED ORDER — LACTATED RINGERS IV SOLN: INTRAVENOUS | Status: DC

## 2022-05-25 NOTE — Anesthesia Procedure Notes (Addendum)
Epidural Patient location during procedure: OB Start time: 05/25/2022 4:17 PM End time: 05/25/2022 4:31 PM  Staffing Anesthesiologist: Pervis Hocking, DO Performed: anesthesiologist   Preanesthetic Checklist Completed: patient identified, IV checked, risks and benefits discussed, monitors and equipment checked, pre-op evaluation and timeout performed  Epidural Patient position: sitting Prep: DuraPrep and site prepped and draped Patient monitoring: continuous pulse ox, blood pressure, heart rate and cardiac monitor Approach: midline Location: L3-L4 Injection technique: LOR air  Needle:  Needle type: Tuohy  Needle gauge: 17 G Needle length: 9 cm Needle insertion depth: 6 cm Catheter type: closed end flexible Catheter size: 19 Gauge Catheter at skin depth: 11 cm Test dose: negative  Assessment Sensory level: T8 Events: blood not aspirated, injection not painful, no injection resistance, no paresthesia and negative IV test  Additional Notes Patient identified. Risks/Benefits/Options discussed with patient including but not limited to bleeding, infection, nerve damage, paralysis, failed block, incomplete pain control, headache, blood pressure changes, nausea, vomiting, reactions to medication both or allergic, itching and postpartum back pain. Confirmed with bedside nurse the patient's most recent platelet count. Confirmed with patient that they are not currently taking any anticoagulation, have any bleeding history or any family history of bleeding disorders. Patient expressed understanding and wished to proceed. All questions were answered. Sterile technique was used throughout the entire procedure. Please see nursing notes for vital signs. Test dose was given through epidural catheter and negative prior to continuing to dose epidural or start infusion. Warning signs of high block given to the patient including shortness of breath, tingling/numbness in hands, complete motor  block, or any concerning symptoms with instructions to call for help. Patient was given instructions on fall risk and not to get out of bed. All questions and concerns addressed with instructions to call with any issues or inadequate analgesia.  Reason for block:procedure for pain

## 2022-05-25 NOTE — H&P (Addendum)
OBSTETRIC ADMISSION HISTORY AND PHYSICAL  Kelly Maldonado is a 41 y.o. female [phone] with IUP at [redacted]w[redacted]d by first trimester [place] presenting for SOL, painful contractions since 0600. She reports +FMs, No LOF, no VB, no blurry vision, headaches or peripheral edema, and RUQ pain.  She plans on bottle feeding. She would like birth control post partum but is undecided on method. Daughter is at bedside, she would like her to be the [place] interpreter while she is here.  She did not have prenatal care. Was ambivalent about keeping baby at first.   Dating: By early [place] --->  Estimated Date of Delivery: 05/08/22  Sono:  None charted, none since confirmation [place] per patient   Prenatal History/Complications: Unclear. Initial BP elevated in MAU but no prior vitals. Normal range on recheck.   Past Medical History: Past Medical History:  Diagnosis Date   Medical history non-contributory     Past Surgical History: Past Surgical History:  Procedure Laterality Date   GALLBLADDER SURGERY      Obstetrical History: OB History     Gravida  8   Para  7   Term  7   Preterm  0   AB  0   Living  7      SAB  0   IAB  0   Ectopic  0   Multiple  0   Live Births  2        Obstetric Comments  Pt. States she delivered her first baby here and two other babies at this hospital. Denies prenatal care until now.          Social History Social History   Socioeconomic History   Marital status: Legally Separated    Spouse name: Not on file   Number of children: Not on file   Years of education: Not on file   Highest education level: Not on file  Occupational History   Not on file  Tobacco Use   Smoking status: Never   Smokeless tobacco: Not on file  Substance and Sexual Activity   Alcohol use: No   Drug use: No   Sexual activity: Yes  Other Topics Concern   Not on file  Social History Narrative   Not on file   Social Determinants of Health   Financial Resource Strain:  Not on file  Food Insecurity: Food Insecurity Present (05/25/2022)   Hunger Vital Sign    Worried About Running Out of Food in the Last Year: Often true    Ran Out of Food in the Last Year: Often true  Transportation Needs: No Transportation Needs (05/25/2022)   PRAPARE - Administrator, Civil Service (Medical): No    Lack of Transportation (Non-Medical): No  Physical Activity: Not on file  Stress: Not on file  Social Connections: Not on file    Family History: History reviewed. No pertinent family history.  Allergies: No Known Allergies  Medications Prior to Admission  Medication Sig Dispense Refill Last Dose   Prenatal Vit-Fe Fumarate-FA (PRENATAL COMPLETE) 14-0.4 MG TABS Take 1 tablet by mouth every morning. 30 each 12 05/25/2022   ferrous sulfate (FERROUSUL) 325 (65 FE) MG tablet Take 1 tablet (325 mg total) by mouth 2 (two) times daily with a meal. 60 tablet 3 More than a month   metoCLOPramide (REGLAN) 10 MG tablet Take 1 tablet (10 mg total) by mouth 4 (four) times daily -  before meals and at bedtime. 30 tablet 0 More than a  month     Review of Systems   All systems reviewed and negative except as stated in HPI  Blood pressure 120/76, pulse 84, temperature 98.1 F (36.7 C), temperature source Oral, resp. rate 20, height 5' 2.99" (1.6 m), weight 88 kg, last menstrual period 08/01/2021, SpO2 97 %, unknown if currently breastfeeding. General appearance: alert and cooperative Lungs: clear to auscultation bilaterally Heart: regular rate and rhythm Abdomen: soft, non-tender; bowel sounds normal Pelvic: deferred, see MAU cervical exam below Extremities: Homans sign is negative, no sign of DVT Presentation: cephalic Fetal monitoringBaseline: 125 bpm, Variability: Good {> 6 bpm), Accelerations: Reactive, and Decelerations: Absent Uterine activityFrequency: Every 3 minutes Dilation: 3 Effacement (%): 90 Station: -1 Exam by:: Dr. Crissie Reese   Prenatal labs: ABO,  Rh: --/--/O POS (10/12 1209) Antibody: NEG (10/12 1209) Rubella:   RPR:    HBsAg: NON REACTIVE (10/12 1209)  HIV: NON REACTIVE (10/12 1209)  GBS: NEGATIVE/-- (10/12 1222)  1 hr Glucola- none Genetic screening  not done Anatomy US not done  Prenatal Transfer Tool  Maternal Diabetes: No Genetic Screening: did not obtain Maternal Ultrasounds/Referrals: Other: none Fetal Ultrasounds or other Referrals:  None Maternal Substance Abuse:  No Significant Maternal Medications:  None Significant Maternal Lab Results:  Group B Strep negative Number of Prenatal Visits:Less than or equal to 3 verified prenatal visits Other Comments:  None  Results for orders placed or performed during the hospital encounter of 05/25/22 (from the past 24 hour(s))  CBC   Collection Time: 05/25/22 12:09 PM  Result Value Ref Range   WBC 12.5 (H) 4.0 - 10.5 K/uL   RBC 4.99 3.87 - 5.11 MIL/uL   Hemoglobin 10.4 (L) 12.0 - 15.0 g/dL   HCT 58.8 (L) 32.5 - 49.8 %   MCV 68.9 (L) 80.0 - 100.0 fL   MCH 20.8 (L) 26.0 - 34.0 pg   MCHC 30.2 30.0 - 36.0 g/dL   RDW 26.4 (H) 15.8 - 30.9 %   Platelets 241 150 - 400 K/uL   nRBC 0.0 0.0 - 0.2 %  Rapid HIV screen (HIV 1/2 Ab+Ag)   Collection Time: 05/25/22 12:09 PM  Result Value Ref Range   HIV-1 P24 Antigen - HIV24 NON REACTIVE NON REACTIVE   HIV 1/2 Antibodies NON REACTIVE NON REACTIVE   Interpretation (HIV Ag Ab)      A non reactive test result means that HIV 1 or HIV 2 antibodies and HIV 1 p24 antigen were not detected in the specimen.  Hepatitis B surface antigen   Collection Time: 05/25/22 12:09 PM  Result Value Ref Range   Hepatitis B Surface Ag NON REACTIVE NON REACTIVE  Comprehensive metabolic panel   Collection Time: 05/25/22 12:09 PM  Result Value Ref Range   Sodium 136 135 - 145 mmol/L   Potassium 3.1 (L) 3.5 - 5.1 mmol/L   Chloride 112 (H) 98 - 111 mmol/L   CO2 16 (L) 22 - 32 mmol/L   Glucose, Bld 94 70 - 99 mg/dL   BUN <5 (L) 6 - 20 mg/dL    Creatinine, Ser 4.07 (L) 0.44 - 1.00 mg/dL   Calcium 8.6 (L) 8.9 - 10.3 mg/dL   Total Protein 6.7 6.5 - 8.1 g/dL   Albumin 2.7 (L) 3.5 - 5.0 g/dL   AST 17 15 - 41 U/L   ALT 11 0 - 44 U/L   Alkaline Phosphatase 156 (H) 38 - 126 U/L   Total Bilirubin 0.4 0.3 - 1.2 mg/dL   GFR,  Estimated >60 >60 mL/min   Anion gap 8 5 - 15  Type and screen [place]   Collection Time: 05/25/22 12:09 PM  Result Value Ref Range   ABO/RH(D) O POS    Antibody Screen NEG    Sample Expiration      05/28/2022,2359 Performed at [facility], [place] [address] [place], [place] [address]   Group B strep by PCR   Collection Time: 05/25/22 12:22 PM   Specimen: Vaginal/Rectal; Genital  Result Value Ref Range   Group B strep by PCR NEGATIVE NEGATIVE    Patient Active Problem List   Diagnosis Date Noted   Post term pregnancy 05/25/2022   Indication for care in labor or delivery 03/10/2015   UTI (urinary tract infection) in pregnancy in third trimester 01/04/2015   Anemia affecting pregnancy in third trimester, antepartum    Late prenatal care affecting pregnancy     Assessment/Plan:  Kelly Maldonado is a 41 y.o. [id number] at [redacted]w[redacted]d here for post term SOL.   #Labor: painful contractions but still latent labor by cervical dilation. Planning AROM/pitocin for augmentation if needed. #Pain: Wants epidural  #FWB: Category 1 #ID:  GBS neg #MOF: bottle #MOC: undecided, does desire contraception. Was ambivalent about keeping this pregnancy throughout, this was primary reason for not accessing prenatal care.  #Circ:  Yes if boy  Will need social work consult post partum- endorses financial and food insecurity.  Cecilio Asper, MD  05/25/2022, 2:51 PM   Fellow Attestation  I saw and evaluated the patient, performing the key elements of the service.I  personally performed or re-performed the history, physical exam, and medical decision making activities of this service and have  verified that the service and findings are accurately documented in the resident's note. I developed the management plan that is described in the resident's note, and I agree with the content, with my edits above.   Stormy Card, MD,[facility] OB Fellow, Faculty Practice  05/25/2022 9:11 PM

## 2022-05-25 NOTE — Consult Note (Signed)
Neonatology Note:  Attendance at [facility]:   Our team responded to a [facility] call to room # 44 following NSVD, due to infant with shoulder dystocia. The requesting provider was CRESENZO-DISHMON, FRANCES. The mother is a G28P7 at [redacted]w[redacted]d. ROM occurred 4h 53m prior to delivery and the fluid was light meconium.  At delivery, the baby with ~1 minute shoulder dystocia. The OB team successfully delivered and placed baby on mom's belly as we were arriving.  Good tone and cry.  +60 sec DCC.  Baby dried and stimulated.  Baby brought to warmer and assessed. Sao2 appropriate.  Clavicles intact, lungs clearing, appropriate affect, reflexes, and grimacing.  Ap 7/9.  I spoke with the mother in the DR with daughter interpreting.  Initiate routine care of baby to the Pediatrician's care.   Please do not hesitate in contacting us if further concerns.   Monia Sabal Katherina Mires, MD

## 2022-05-25 NOTE — Progress Notes (Signed)
   05/25/22 2320  Clinical Encounter Type  Visited With Patient not available  Visit Type Initial  Referral From Nurse  Consult/Referral To Chaplain   Elsah responded to the page. The patient  and baby were being attended to by the medical team and the baby could be heard crying  CH support was not needed at this time. CH remains available for follow up support as needed.This note was prepared by Jeanine Luz, M.Div..  For questions please contact by phone [phone].

## 2022-05-25 NOTE — MAU Provider Note (Signed)
History     [id number]  Arrival date and time: 05/25/22 1132    Chief Complaint  Patient presents with   Labor Eval     HPI Roda Lauture is a 41 y.o. at [redacted]w[redacted]d by 5wk [place], who presents for contractions.   Patient has not been seen since MAU visit in 08/2021 when she had accurate dating established When asked why she shrugs Later outside the room interpreter reports she has been very ambivalent about pregnancy and did not want to continue, never went to prenatal care Endorses painful contractions today No bleeding or leaking fluid Normal fetal movement      OB History     Gravida  8   Para  7   Term  7   Preterm  0   AB  0   Living  7      SAB  0   IAB  0   Ectopic  0   Multiple  0   Live Births  2        Obstetric Comments  Pt. States she delivered her first baby here and two other babies at this hospital. Denies prenatal care until now.          Past Medical History:  Diagnosis Date   Medical history non-contributory     Past Surgical History:  Procedure Laterality Date   GALLBLADDER SURGERY      History reviewed. No pertinent family history.  Social History   Socioeconomic History   Marital status: Legally Separated    Spouse name: Not on file   Number of children: Not on file   Years of education: Not on file   Highest education level: Not on file  Occupational History   Not on file  Tobacco Use   Smoking status: Never   Smokeless tobacco: Not on file  Substance and Sexual Activity   Alcohol use: No   Drug use: No   Sexual activity: Yes  Other Topics Concern   Not on file  Social History Narrative   Not on file   Social Determinants of Health   Financial Resource Strain: Not on file  Food Insecurity: Not on file  Transportation Needs: Not on file  Physical Activity: Not on file  Stress: Not on file  Social Connections: Not on file  Intimate Partner Violence: Not on file    No Known Allergies  No  current facility-administered medications on file prior to encounter.   Current Outpatient Medications on File Prior to Encounter  Medication Sig Dispense Refill   ferrous sulfate (FERROUSUL) 325 (65 FE) MG tablet Take 1 tablet (325 mg total) by mouth 2 (two) times daily with a meal. 60 tablet 3   metoCLOPramide (REGLAN) 10 MG tablet Take 1 tablet (10 mg total) by mouth 4 (four) times daily -  before meals and at bedtime. 30 tablet 0   Prenatal Vit-Fe Fumarate-FA (PRENATAL COMPLETE) 14-0.4 MG TABS Take 1 tablet by mouth every morning. 30 each 12     ROS Pertinent positives and negative per HPI, all others reviewed and negative  Physical Exam   BP (!) 143/80   Pulse 90   Temp 98.2 F (36.8 C)   Resp 18   Wt 88 kg   LMP 08/01/2021   BMI 34.37 kg/m   Patient Vitals for the past 24 hrs:  BP Temp Pulse Resp Weight  05/25/22 1151 (!) 143/80 98.2 F (36.8 C) 90 18 88 kg    Physical Exam  Vitals reviewed.  Constitutional:      General: She is not in acute distress.    Appearance: She is well-developed. She is not diaphoretic.  Eyes:     General: No scleral icterus. Pulmonary:     Effort: Pulmonary effort is normal. No respiratory distress.  Skin:    General: Skin is warm and dry.  Neurological:     Mental Status: She is alert.     Coordination: Coordination normal.      Cervical Exam Dilation: 3 Effacement (%): 90 Station: -1 Presentation: Vertex Exam by:: Dr. Crissie Reese  Bedside Ultrasound Not done  My interpretation: n/a  FHT Baseline 135, moderate variability, +accels, no decels Toco: regular contractions q2-3 min Cat: I  Labs No results found for this or any previous visit (from the past 24 hour(s)).  Imaging No results found.  MAU Course  Procedures  Lab Orders         Urinalysis, Routine w reflex microscopic Urine, Clean Catch         Protein / creatinine ratio, urine    No orders of the defined types were placed in this encounter.  Imaging  Orders  No imaging studies ordered today    MDM moderate  Assessment and Plan  #Early labor #Post dates pregnancy Admit to L&D for postdates pregnancy and early labor Signout given Will need prenatal labs and SW consult  #FWB FHT Cat I NST: Reactive   Dispo: admit to labor, signout given   Venora Maples, MD/[facility] 05/25/22 12:13 PM

## 2022-05-25 NOTE — MAU Note (Signed)
.  Kelly Maldonado is a 41 y.o. at [redacted]w[redacted]d here in MAU reporting: c/o ctx since 6am now 3-4 min. Good fetal movement . Denies any leaking or bleeding. No prenatal care LMP:  Onset of complaint: 6am Pain score: 6 Vitals:   05/25/22 1151  BP: (!) 143/80  Pulse: 90  Resp: 18  Temp: 98.2 F (36.8 C)     FHT:141 Lab orders placed from triage: labor eval, U/A

## 2022-05-25 NOTE — Anesthesia Preprocedure Evaluation (Signed)
Anesthesia Evaluation  Patient identified by MRN, date of birth, ID band Patient awake    Reviewed: Allergy & Precautions, Patient's Chart, lab work & pertinent test results  Airway Mallampati: II  TM Distance: >3 FB Neck ROM: Full    Dental no notable dental hx.    Pulmonary neg pulmonary ROS,    Pulmonary exam normal breath sounds clear to auscultation       Cardiovascular negative cardio ROS Normal cardiovascular exam Rhythm:Regular Rate:Normal     Neuro/Psych negative neurological ROS  negative psych ROS   GI/Hepatic negative GI ROS, Neg liver ROS,   Endo/Other  BMI 34  Renal/GU negative Renal ROS  negative genitourinary   Musculoskeletal negative musculoskeletal ROS (+)   Abdominal   Peds negative pediatric ROS (+)  Hematology  (+) Blood dyscrasia, anemia , Hb 10.4, plt 241   Anesthesia Other Findings   Reproductive/Obstetrics (+) Pregnancy Grand multip, first epidural                             Anesthesia Physical Anesthesia Plan  ASA: 2  Anesthesia Plan: Epidural   Post-op Pain Management:    Induction:   PONV Risk Score and Plan: 2  Airway Management Planned: Natural Airway  Additional Equipment: None  Intra-op Plan:   Post-operative Plan:   Informed Consent: I have reviewed the patients History and Physical, chart, labs and discussed the procedure including the risks, benefits and alternatives for the proposed anesthesia with the patient or authorized representative who has indicated his/her understanding and acceptance.       Plan Discussed with:   Anesthesia Plan Comments:         Anesthesia Quick Evaluation

## 2022-05-25 NOTE — Discharge Summary (Signed)
Postpartum Discharge Summary  Patient Name: Kelly Maldonado DOB: 24-Oct-1980 MRN: [id number]  Date of admission: 05/25/2022 Delivery date:05/25/2022  Delivering provider: Jacklyn Shell  Date of discharge: 05/27/2022  Admitting diagnosis: Post term pregnancy [O48.0] Intrauterine pregnancy: [redacted]w[redacted]d     Secondary diagnosis:  Principal Problem:   Post term pregnancy  Additional problems: AMA    Discharge diagnosis: Term Pregnancy Delivered                                              Post partum procedures: n/a Augmentation: AROM Complications: None  Hospital course: Onset of Labor With Vaginal Delivery      41 y.o. yo [id number] at [redacted]w[redacted]d was admitted in Latent Labor on 05/25/2022.   Membrane Rupture Time/Date: 7:09 PM ,05/25/2022   Delivery Method:Vaginal, Spontaneous  Episiotomy: None  Lacerations:  None  Patient had a postpartum course complicated by none.  She is ambulating, tolerating a regular diet, passing flatus, and urinating well. Patient is discharged home in stable condition on 05/27/22.  Newborn Data: Birth date:05/25/2022  Birth time:11:18 PM  Gender:Female  Living status:Living  Apgars:7 ,9  Weight:4020 g   Magnesium Sulfate received: No BMZ received: No Rhophylac:N/A MMR:N/A T-DaP:Given postpartum Flu: Yes Transfusion:No  Physical exam  Vitals:   05/26/22 1035 05/26/22 1500 05/26/22 2014 05/27/22 0531  BP: 106/61 117/75 116/61 (!) 100/57  Pulse: 71 72 71 (!) 54  Resp: 16 16 18 18   Temp: 98.1 F (36.7 C) 98.2 F (36.8 C) 98.4 F (36.9 C) 98.2 F (36.8 C)  TempSrc: Oral Oral Oral Oral  SpO2: 99% 99%    Weight:      Height:       General: alert, cooperative, and no distress Lochia: appropriate Uterine Fundus: firm Incision: N/A DVT Evaluation: No evidence of DVT seen on physical exam. Labs: Lab Results  Component Value Date   WBC 12.5 (H) 05/25/2022   HGB 10.4 (L) 05/25/2022   HCT 34.4 (L) 05/25/2022   MCV 68.9 (L)  05/25/2022   PLT 241 05/25/2022      Latest Ref Rng & Units 05/25/2022   12:09 PM  CMP  Glucose 70 - 99 mg/dL 94   BUN 6 - 20 mg/dL <5   Creatinine 2.44 - 1.00 mg/dL 0.10   Sodium 272 - 536 mmol/L 136   Potassium 3.5 - 5.1 mmol/L 3.1   Chloride 98 - 111 mmol/L 112   CO2 22 - 32 mmol/L 16   Calcium 8.9 - 10.3 mg/dL 8.6   Total Protein 6.5 - 8.1 g/dL 6.7   Total Bilirubin 0.3 - 1.2 mg/dL 0.4   Alkaline Phos 38 - 126 U/L 156   AST 15 - 41 U/L 17   ALT 0 - 44 U/L 11    Edinburgh Score:    05/26/2022    6:30 PM  Edinburgh Postnatal Depression Scale Screening Tool  I have been able to laugh and see the funny side of things. 1  I have looked forward with enjoyment to things. 3  I have blamed myself unnecessarily when things went wrong. 2  I have been anxious or worried for no good reason. 2  I have felt scared or panicky for no good reason. 1  Things have been getting on top of me. 0  I have been so unhappy that I have had difficulty sleeping. 2  I  have felt sad or miserable. 2  I have been so unhappy that I have been crying. 1  The thought of harming myself has occurred to me. 0  Edinburgh Postnatal Depression Scale Total 14     After visit meds:  Allergies as of 05/27/2022   No Known Allergies      Medication List     STOP taking these medications    metoCLOPramide 10 MG tablet Commonly known as: REGLAN   Prenatal Complete 14-0.4 MG Tabs       TAKE these medications    acetaminophen 325 MG tablet Commonly known as: Tylenol Take 2 tablets (650 mg total) by mouth every 4 (four) hours as needed (for pain scale < 4).   benzocaine-Menthol 20-0.5 % Aero Commonly known as: DERMOPLAST Apply 1 Application topically as needed for irritation (perineal discomfort).   docusate sodium 100 MG capsule Commonly known as: COLACE Take 1 capsule (100 mg total) by mouth 2 (two) times daily.   ferrous sulfate 325 (65 FE) MG tablet Take 1 tablet (325 mg total) by mouth  every other day. Start taking on: May 28, 2022 What changed: when to take this   ibuprofen 600 MG tablet Commonly known as: ADVIL Take 1 tablet (600 mg total) by mouth every 6 (six) hours.         Discharge home in stable condition Infant Feeding: Bottle and Breast Infant Disposition:home with mother Discharge instruction: per After Visit Summary and Postpartum booklet. Activity: Advance as tolerated. Pelvic rest for 6 weeks.  Diet: routine diet Future Appointments:No future appointments.  Follow up Visit:  Please schedule this patient for a In person postpartum visit in 4 weeks with the following provider: Any provider. Additional Postpartum F/U:   pregnancy complicated by: no [facility] Delivery mode:  Vaginal, Spontaneous  Anticipated Birth Control:  Unsure   05/27/2022 Myrtie Hawk, DO

## 2022-05-26 ENCOUNTER — Encounter (HOSPITAL_COMMUNITY): Payer: Self-pay | Admitting: Obstetrics and Gynecology

## 2022-05-26 LAB — RUBELLA SCREEN: Rubella: 11.6 index (ref >0.99)

## 2022-05-26 LAB — GC/CHLAMYDIA PROBE AMP (~~LOC~~) NOT AT ARMC
Chlamydia: NEGATIVE
Comment: NEGATIVE
Comment: NORMAL
Neisseria Gonorrhea: NEGATIVE

## 2022-05-26 LAB — RPR: RPR Ser Ql: NONREACTIVE

## 2022-05-26 MED ORDER — INFLUENZA VAC SPLIT QUAD 0.5 ML IM SUSY
0.5000 mL | PREFILLED_SYRINGE | INTRAMUSCULAR | Status: AC
  Administered 2022-05-26: 0.5 mL via INTRAMUSCULAR
  Filled 2022-05-26: qty 0.5

## 2022-05-26 MED ORDER — TETANUS-DIPHTH-ACELL PERTUSSIS 5-2.5-18.5 LF-MCG/0.5 IM SUSY
0.5000 mL | PREFILLED_SYRINGE | Freq: Once | INTRAMUSCULAR | Status: AC
  Administered 2022-05-26: 0.5 mL via INTRAMUSCULAR
  Filled 2022-05-26: qty 0.5

## 2022-05-26 MED ORDER — MEDROXYPROGESTERONE ACETATE 150 MG/ML IM SUSP: 150.0000 mg | INTRAMUSCULAR | Status: DC | PRN

## 2022-05-26 MED ORDER — ONDANSETRON HCL 4 MG/2ML IJ SOLN: 4.0000 mg | INTRAMUSCULAR | Status: DC | PRN

## 2022-05-26 MED ORDER — ACETAMINOPHEN 325 MG PO TABS: 650.0000 mg | ORAL_TABLET | ORAL | Status: DC | PRN

## 2022-05-26 MED ORDER — DIPHENHYDRAMINE HCL 25 MG PO CAPS: 25.0000 mg | ORAL_CAPSULE | Freq: Four times a day (QID) | ORAL | Status: DC | PRN

## 2022-05-26 MED ORDER — SIMETHICONE 80 MG PO CHEW: 80.0000 mg | CHEWABLE_TABLET | ORAL | Status: DC | PRN

## 2022-05-26 MED ORDER — PRENATAL MULTIVITAMIN CH
1.0000 | ORAL_TABLET | Freq: Every day | ORAL | Status: DC
  Administered 2022-05-26 – 2022-05-27 (×2): 1 via ORAL
  Filled 2022-05-26 (×3): qty 1

## 2022-05-26 MED ORDER — WITCH HAZEL-GLYCERIN EX PADS: 1.0000 | MEDICATED_PAD | CUTANEOUS | Status: DC | PRN

## 2022-05-26 MED ORDER — FERROUS SULFATE 325 (65 FE) MG PO TABS
325.0000 mg | ORAL_TABLET | ORAL | Status: DC
  Administered 2022-05-26: 325 mg via ORAL
  Filled 2022-05-26: qty 1

## 2022-05-26 MED ORDER — MEASLES, MUMPS & RUBELLA VAC IJ SOLR: 0.5000 mL | Freq: Once | INTRAMUSCULAR | Status: DC

## 2022-05-26 MED ORDER — IBUPROFEN 600 MG PO TABS
600.0000 mg | ORAL_TABLET | Freq: Four times a day (QID) | ORAL | Status: DC
  Administered 2022-05-26 – 2022-05-27 (×6): 600 mg via ORAL
  Filled 2022-05-26 (×7): qty 1

## 2022-05-26 MED ORDER — COCONUT OIL OIL: 1.0000 | TOPICAL_OIL | Status: DC | PRN

## 2022-05-26 MED ORDER — DOCUSATE SODIUM 100 MG PO CAPS
100.0000 mg | ORAL_CAPSULE | Freq: Two times a day (BID) | ORAL | Status: DC
  Administered 2022-05-26 – 2022-05-27 (×4): 100 mg via ORAL
  Filled 2022-05-26 (×4): qty 1

## 2022-05-26 MED ORDER — BENZOCAINE-MENTHOL 20-0.5 % EX AERO: 1.0000 | INHALATION_SPRAY | CUTANEOUS | Status: DC | PRN

## 2022-05-26 MED ORDER — ONDANSETRON HCL 4 MG PO TABS: 4.0000 mg | ORAL_TABLET | ORAL | Status: DC | PRN

## 2022-05-26 MED ORDER — DIBUCAINE (PERIANAL) 1 % EX OINT: 1.0000 | TOPICAL_OINTMENT | CUTANEOUS | Status: DC | PRN

## 2022-05-26 NOTE — Progress Notes (Signed)
Post Partum Day 1 Subjective: no complaints, up ad lib, voiding and tolerating PO, small lochia, plans to breastfeed, plans to bottle feed, abstinence  Objective: Blood pressure 108/66, pulse 86, temperature 98.5 F (36.9 C), temperature source Oral, resp. rate 16, height 5' 2.99" (1.6 m), weight 88 kg, last menstrual period 08/01/2021, SpO2 100 %, unknown if currently breastfeeding.  Physical Exam:  General: alert, cooperative and no distress Lochia:normal flow Chest: CTAB Heart: RRR no m/r/g Abdomen: +BS, soft, nontender,  Uterine Fundus: firm DVT Evaluation: No evidence of DVT seen on physical exam. Extremities: trace edema  Recent Labs    05/25/22 1209  HGB 10.4*  HCT 34.4*    Assessment/Plan: No PNC, SW consult   LOS: 1 day   Christin Fudge 05/26/2022, 7:59 AM

## 2022-05-26 NOTE — Progress Notes (Signed)
After pt used the bathroom, I assisted her with peri care and when we were finished pt attempted to stand while I was grabbing the stedy. While pt attempted to stand she began to fall but she caught herself and held on to the stedy. She did not hit the ground. I helped the pt up and we got her on the stedy and got back in bed. I got an interpreter on the ipad and made sure she was ok. pt stated she was ok.
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
## 2022-05-26 NOTE — Progress Notes (Signed)
CSW completed WIC referral.   Anaysia Germer, LCSW Clinical Social Worker [facility] Cell#: [phone]  

## 2022-05-27 MED ORDER — DOCUSATE SODIUM 100 MG PO CAPS: 100.0000 mg | ORAL_CAPSULE | Freq: Two times a day (BID) | ORAL | 0 refills | Status: AC

## 2022-05-27 MED ORDER — BENZOCAINE-MENTHOL 20-0.5 % EX AERO: 1.0000 | INHALATION_SPRAY | CUTANEOUS | 0 refills | Status: AC | PRN

## 2022-05-27 MED ORDER — ACETAMINOPHEN 325 MG PO TABS: 650.0000 mg | ORAL_TABLET | ORAL | 0 refills | Status: AC | PRN

## 2022-05-27 MED ORDER — FERROUS SULFATE 325 (65 FE) MG PO TABS: 325.0000 mg | ORAL_TABLET | ORAL | 3 refills | Status: AC

## 2022-05-27 MED ORDER — IBUPROFEN 600 MG PO TABS: 600.0000 mg | ORAL_TABLET | Freq: Four times a day (QID) | ORAL | 0 refills | Status: AC

## 2022-05-27 NOTE — Anesthesia Postprocedure Evaluation (Signed)
Anesthesia Post Note  Patient: Doctor, general practice  Procedure(s) Performed: AN AD HOC LABOR EPIDURAL     Patient location during evaluation: PACU Anesthesia Type: Epidural Level of consciousness: awake and alert Pain management: pain level controlled Vital Signs Assessment: post-procedure vital signs reviewed and stable Respiratory status: spontaneous breathing, nonlabored ventilation and respiratory function stable Cardiovascular status: blood pressure returned to baseline Postop Assessment: epidural receding, no apparent nausea or vomiting, no headache and no backache Anesthetic complications: no Comments: Patient initially with weak left quadriceps strength yesterday which has now completely resolved. She has been up walking and using restroom with no problems. Suspect contribution of traumatic delivery (shoulder dystocia requiring McRobert's maneuver) to transient femoral nerve palsy.   Mathews Robinsons, MD, MPH Anesthesiology    No notable events documented.  Last Vitals:  Vitals:   05/26/22 2014 05/27/22 0531  BP: 116/61 (!) 100/57  Pulse: 71 (!) 54  Resp: 18 18  Temp: 36.9 C 36.8 C  SpO2:      Last Pain:  Vitals:   05/27/22 0915  TempSrc:   PainSc: 0-No pain   Pain Goal:                   Marthenia Rolling

## 2022-05-27 NOTE — Progress Notes (Signed)
CSW received consult due to MOB's score of 14 on Edinburgh Postnatal Depression Scale. CSW met with MOB to offer support and complete assessment. When CSW entered room, MOB was observed sitting in hospital bed, bonding with infant "Angel." CSW used [employer] translation services, Daniel ID# [id number] to complete consult. CSW introduced self and explained reason for consult. MOB welcomed CSW and remained engaged during consult.   CSW reviewed MOB's EPDS scores with MOB and assessed for symptoms of PMADs. MOB reports she is feeling better since giving birth, sharing that she felt sad during her pregnancy due to FOB not helping and due to having to stop working. MOB reports she "feels much better now." MOB denied symptoms of PMADs and identified her daughter as a support. MOB reports she feels bonded to infant. CSW provided MOB with information about [facility] home visiting program. MOB expressed interest and provided verbal consent for CSW to place a referral. MOB denied current SI/HI.  CSW provided education regarding the baby blues period vs. perinatal mood disorders, discussed treatment and gave resources for mental health follow up if concerns arise during consult 05/26/22. MOB denied additional resource needs.   CSW identifies no further need for intervention and no barriers to discharge at this time.  Signed,  Ashle Stief K. Aaliyha Mumford, MSW, LCSWA, LCASA 05/27/2022 6:10 PM 

## 2022-05-27 NOTE — Progress Notes (Signed)
Used inhouse Spanish interpreter

## 2022-05-28 LAB — URINE CULTURE: Culture: NO GROWTH

## 2022-05-29 ENCOUNTER — Telehealth (HOSPITAL_COMMUNITY): Payer: Self-pay | Admitting: *Deleted

## 2022-05-29 DIAGNOSIS — Z1331 Encounter for screening for depression: Secondary | ICD-10-CM

## 2022-05-29 NOTE — Telephone Encounter (Signed)
EPDS in hospital on 05/26/22 = 14. Referral for Integrated Behavioral Health generated and sent to Dr. Lynnda Shields. Erline Levine, RN, 05/29/22, [phone]

## 2022-06-01 NOTE — BH Specialist Note (Signed)
Pt did not arrive to video visit and did not answer the phone; Left HIPPA-compliant message to call back Roselyn Reef from [employer] for [employer] at [facility] at  [phone] [facility] office), via [place] interpreter, [place], [place].

## 2022-06-03 ENCOUNTER — Telehealth (HOSPITAL_COMMUNITY): Payer: Self-pay | Admitting: *Deleted

## 2022-06-03 NOTE — Telephone Encounter (Signed)
Call unable to connect.  Odis Hollingshead, [place] 06-03-2022 at 1:46pm

## 2022-06-15 ENCOUNTER — Ambulatory Visit: Payer: Self-pay | Admitting: Clinical

## 2022-06-15 DIAGNOSIS — Z91199 Patient's noncompliance with other medical treatment and regimen due to unspecified reason: Secondary | ICD-10-CM

## 2022-06-26 ENCOUNTER — Ambulatory Visit: Payer: Self-pay | Admitting: Obstetrics & Gynecology

## 2022-06-27 ENCOUNTER — Ambulatory Visit: Payer: Self-pay

## 2023-03-01 IMAGING — US US OB < 14 WEEKS - US OB TV
1 series · 15 of 28 positions shown · non-contrast
Comparison: None.

CLINICAL DATA: Left lower quadrant pain with cramping.

EXAM:
OBSTETRIC <14 WK US AND TRANSVAGINAL OB US
TECHNIQUE: Both transabdominal and transvaginal ultrasound examinations were
performed for complete evaluation of the gestation as well as the
maternal uterus, adnexal regions, and pelvic cul-de-sac.
Transvaginal technique was performed to assess early pregnancy.

[Series 1: us ob < 14 weeks - us ob tv · 15 of 43 slices shown]
[im 1/43]
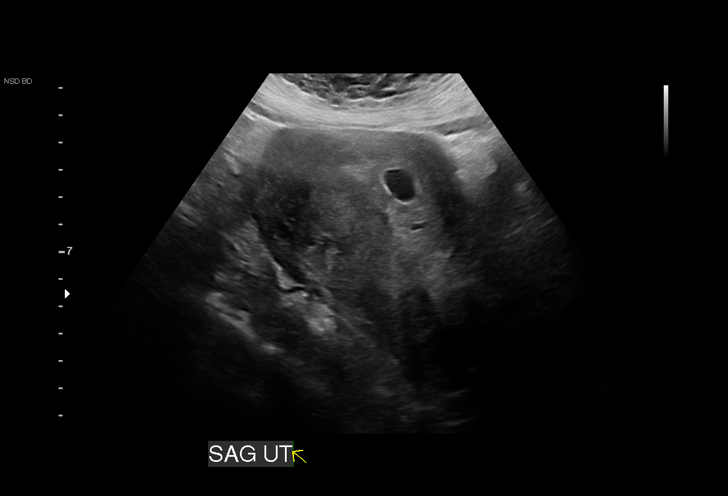
[im 4/43]
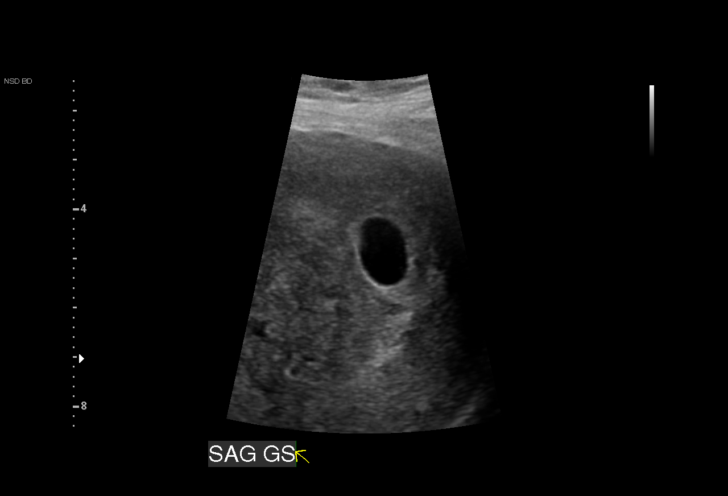
[im 7/43]
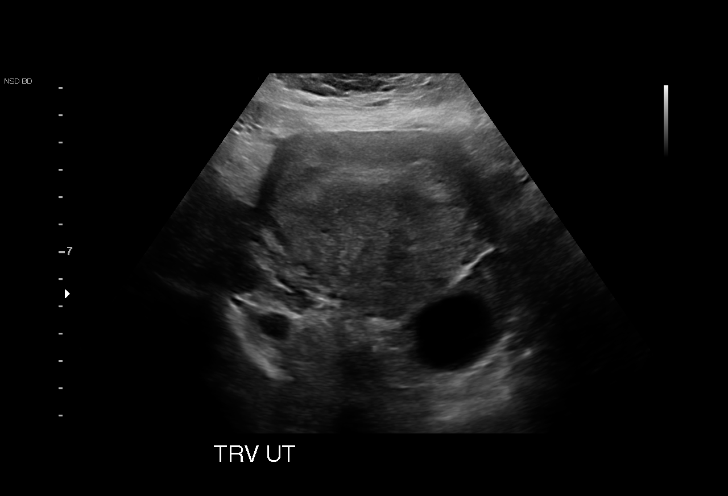
[im 10/43]
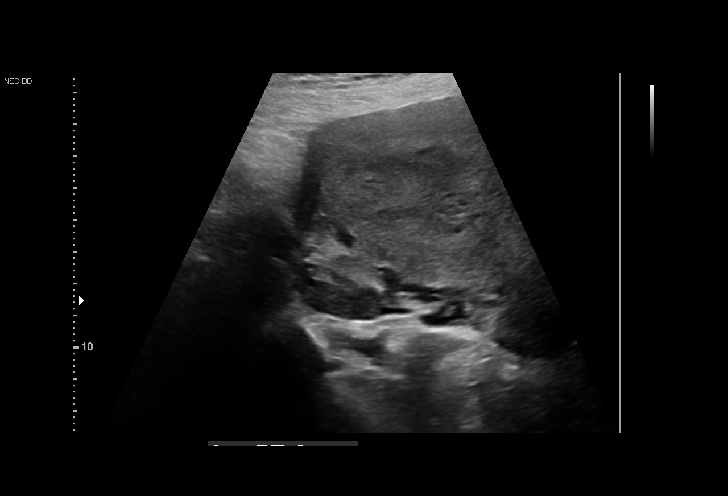
[im 13/43]
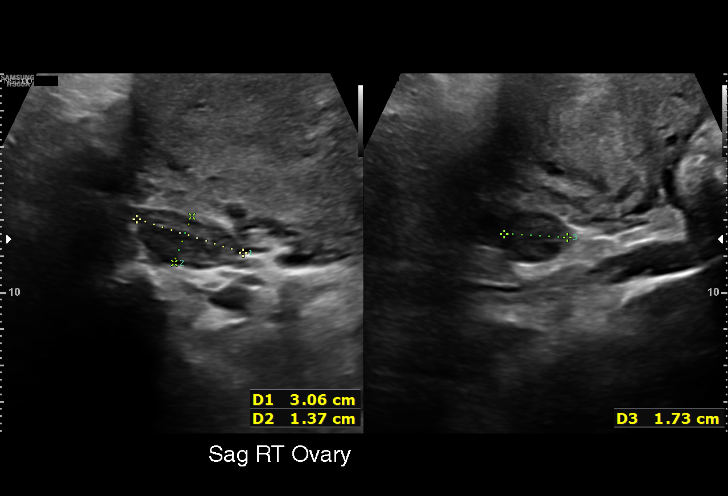
[im 16/43]
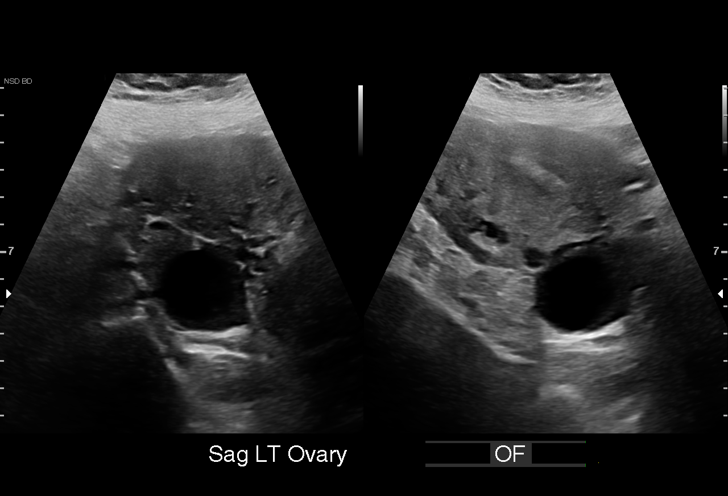
[im 19/43]
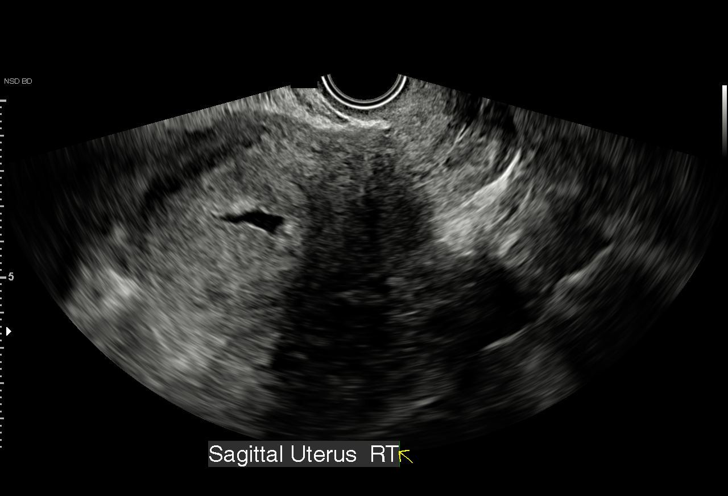
[im 22/43]
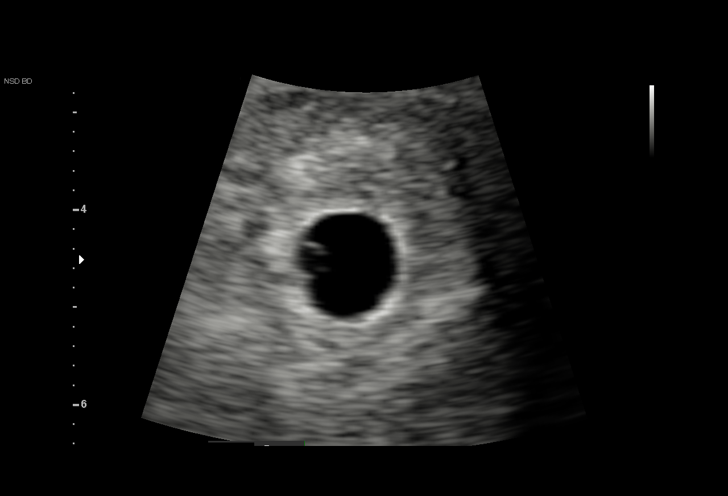
[im 24/43]
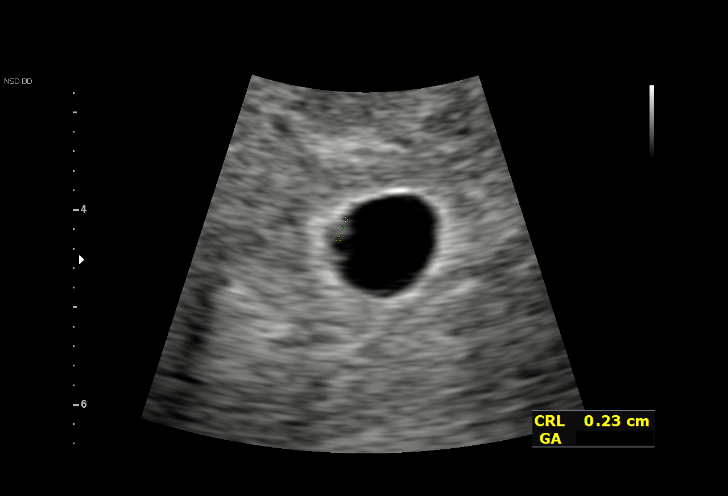
[im 27/43]
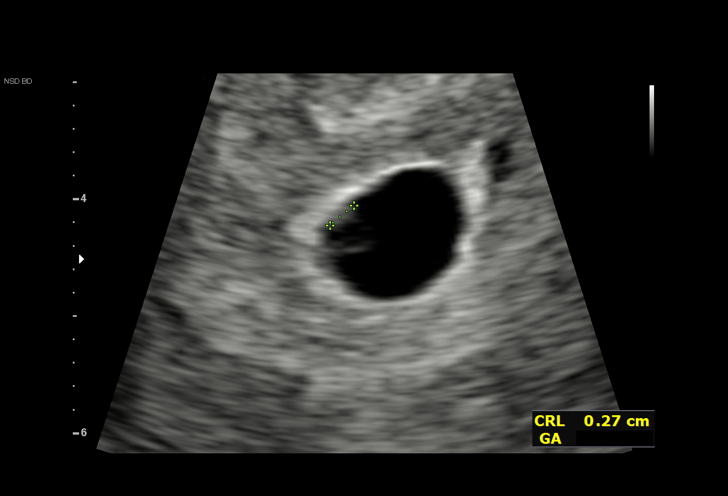
[im 30/43]
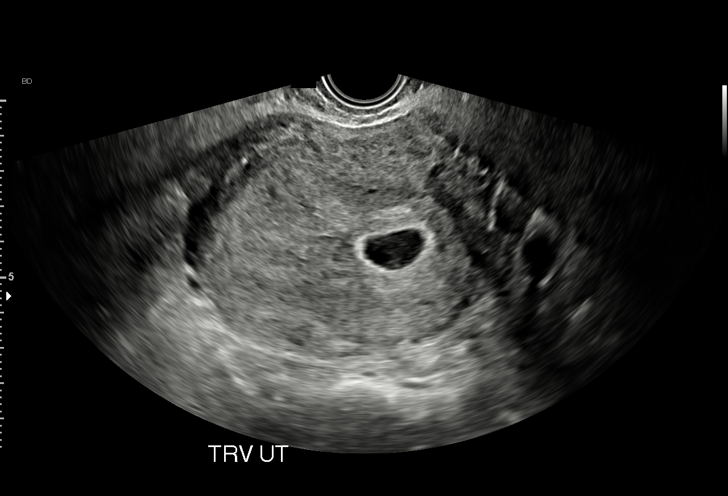
[im 33/43]
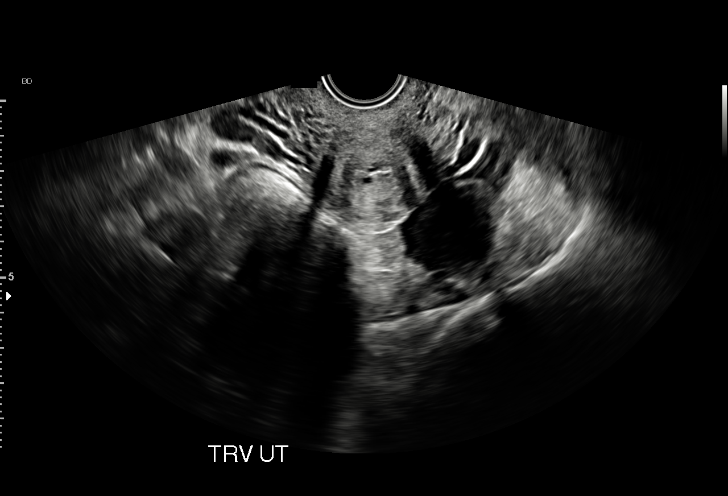
[im 36/43]
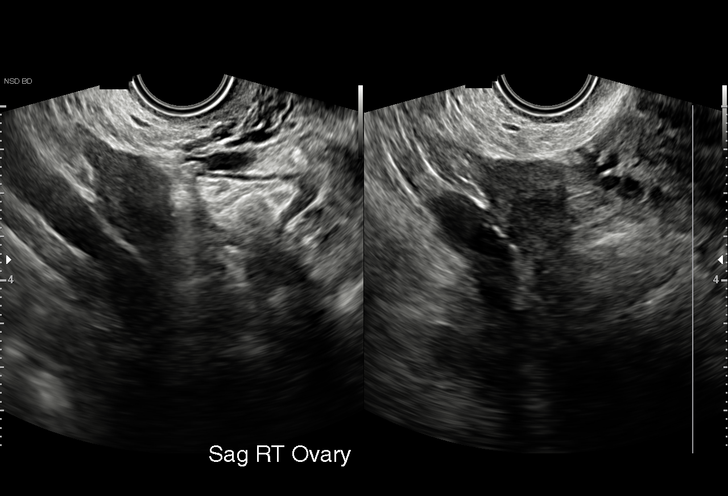
[im 39/43]
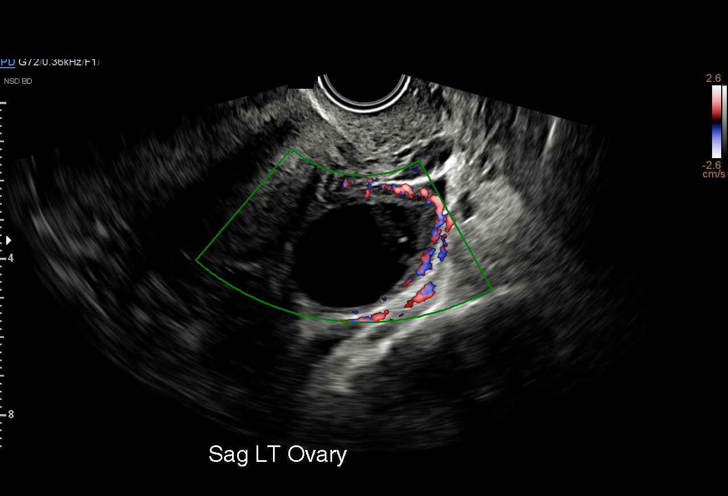
[im 43/43]
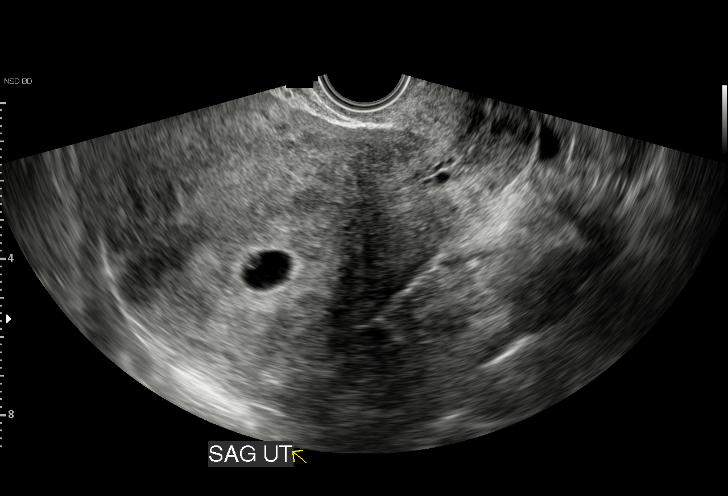

[15 of 28 positions shown; findings below may reference images not displayed]

FINDINGS: Intrauterine gestational sac: Single

Yolk sac:  Visualized.

Embryo:  Visualized.

Cardiac Activity: Visualized.

Heart Rate: 99 bpm

CRL:  2.5 mm   5 w   5 d                  US EDC: 05/08/2022

Subchorionic hemorrhage:  None visualized.

Maternal uterus/adnexae: Corpus luteum cyst noted in the left ovary.
The ovaries otherwise appear within normal limits. There is no free
fluid in the pelvis. There is a trace amount of fluid within the
endometrial.
IMPRESSION: 1. Single live intrauterine gestation measuring 5 weeks 5 days by
length.
2. Heart rate is lower limits of normal. Recommended short-term
follow-up ultrasound to confirm viability.
3. Small amount of fluid in the endometrium.
